# Patient Record
Sex: Female | Born: 1935 | Race: White | Hispanic: No | Smoking: Never smoker
Health system: Southern US, Community
[De-identification: ages and names within clinical notes are randomized; demographics above are authoritative.]

## PROBLEM LIST (undated history)

## (undated) DIAGNOSIS — I10 Essential (primary) hypertension: Secondary | ICD-10-CM

## (undated) DIAGNOSIS — E119 Type 2 diabetes mellitus without complications: Secondary | ICD-10-CM

## (undated) DIAGNOSIS — H548 Legal blindness, as defined in USA: Secondary | ICD-10-CM

## (undated) DIAGNOSIS — M1990 Unspecified osteoarthritis, unspecified site: Secondary | ICD-10-CM

## (undated) DIAGNOSIS — G629 Polyneuropathy, unspecified: Secondary | ICD-10-CM

## (undated) HISTORY — PX: ROTATOR CUFF REPAIR: SHX139

## (undated) HISTORY — PX: PARTIAL HIP ARTHROPLASTY: SHX733

## (undated) HISTORY — PX: APPENDECTOMY: SHX54

## (undated) HISTORY — DX: Legal blindness, as defined in USA: H54.8

## (undated) HISTORY — PX: ABDOMINAL HYSTERECTOMY: SHX81

## (undated) HISTORY — DX: Polyneuropathy, unspecified: G62.9

## (undated) HISTORY — DX: Unspecified osteoarthritis, unspecified site: M19.90

## (undated) HISTORY — PX: CHOLECYSTECTOMY: SHX55

---

## 2021-08-23 ENCOUNTER — Encounter: Payer: Self-pay | Admitting: Urology

## 2021-08-23 ENCOUNTER — Other Ambulatory Visit (HOSPITAL_COMMUNITY): Payer: Self-pay | Admitting: Nurse Practitioner

## 2021-08-23 ENCOUNTER — Other Ambulatory Visit: Payer: Self-pay | Admitting: Nurse Practitioner

## 2021-08-23 ENCOUNTER — Ambulatory Visit (INDEPENDENT_AMBULATORY_CARE_PROVIDER_SITE_OTHER): Payer: Medicare Other | Admitting: Urology

## 2021-08-23 ENCOUNTER — Other Ambulatory Visit: Payer: Self-pay

## 2021-08-23 VITALS — BP 113/67 | HR 83 | Ht 63.0 in | Wt 162.0 lb

## 2021-08-23 DIAGNOSIS — R1013 Epigastric pain: Secondary | ICD-10-CM

## 2021-08-23 DIAGNOSIS — R339 Retention of urine, unspecified: Secondary | ICD-10-CM | POA: Diagnosis not present

## 2021-08-23 DIAGNOSIS — R142 Eructation: Secondary | ICD-10-CM

## 2021-08-23 DIAGNOSIS — R140 Abdominal distension (gaseous): Secondary | ICD-10-CM

## 2021-08-23 NOTE — Progress Notes (Signed)
08/23/2021 10:51 AM   Lisa Davis 09-18-1936 [id number]  Referring provider: No referring provider defined for this encounter.  Chief Complaint  Patient presents with   Other    HPI: Lisa Davis is an 85 y.o. female who presents for evaluation of urinary retention.  She presents today with her daughter.  Sustained a fall 07/30/2021 and had severe low back pain the following day.  Was seen at a local ED and was told she had a compression fracture and prescribed oxycodone and a muscle relaxer Pain did not improve and presented to a different ED on 10/28 and was diagnosed with urinary retention and a Foley catheter was placed She presented [facility] in [place] on 10/31 complaining of continued pain and was admitted with a E. coli UTI, compression fracture L1 and severe constipation A voiding trial was attempted 08/10/2021 however the Foley catheter was replaced for persistent retention 08/11/2021.  Her daughter states she was nonambulatory at the time and a voiding trial Discharged to [employer] with an indwelling Foley.  She is currently undergoing physical therapy and has been upright Her constipation has resolved CT performed during hospitalization showed an 8 mm nonobstructing left lower pole calculus She states she was complaining of bladder pain last night and her Foley catheter was removed but then immediately replaced.  She is complaining of bladder pain   PMH:  Abnormal liver function test   Anemia, iron deficiency   Chronic anxiety   Chronic insomnia   Colon polyp   GERD (gastroesophageal reflux disease)   Hearing loss   Hyperlipemia  Tried several statins, declines trying others   Hypertension   Hypothyroidism   Impacted cerumen  07/13/2009   OA (osteoarthritis)   Osteopenia   Peripheral edema   Recurrent major depressive disorder in remission (CMS-HCC)   Sensorineural hearing loss (SNHL), bilateral 05/19/2012   Type 2 diabetes mellitus  without complication (CMS-HCC)   Surgical History:  APPENDECTOMY   CHOLECYSTECTOMY   HEMIARTHROPLASTY HIP Right 01/2018  After hip fracture   HYSTERECTOMY VAGINAL   Left knee surgery   Right shoulder rotator cuff repair   Home Medications:  Allergies as of 08/23/2021       Reactions   Chlorhexidine Rash   Chlorhexidine Gluconate Hives, Rash   Meperidine Nausea And Vomiting, Palpitations   Tachycardia Tachycardia Tachycardia   Statins Other (See Comments)   Intol to several   Meperidine Hcl    Tachycardia   Codeine Nausea And Vomiting   Metformin And Related Diarrhea   Morphine And Related Nausea And Vomiting   Pedi-pre Tape Spray [wound Dressing Adhesive] Rash   Povidone Iodine Rash        Medication List        Accurate as of August 23, 2021 10:51 AM. If you have any questions, ask your nurse or doctor.          acetaminophen 325 MG tablet Commonly known as: TYLENOL Take by mouth.   Calcium Carb-Cholecalciferol 600-10 MG-MCG Tabs Take 1 tablet by mouth daily.   celecoxib 100 MG capsule Commonly known as: CELEBREX Take by mouth.   clobetasol cream 0.05 % Commonly known as: TEMOVATE Apply topically.   clorazepate 3.75 MG tablet Commonly known as: TRANXENE Take by mouth.   cyanocobalamin 1000 MCG/ML injection Commonly known as: (VITAMIN B-12) Inject into the muscle.   cyclobenzaprine 10 MG tablet Commonly known as: FLEXERIL Take 1 tablet by mouth 3 (three) times daily as needed.   fluticasone 50  MCG/ACT nasal spray Commonly known as: FLONASE Place into the nose.   gabapentin 300 MG capsule Commonly known as: NEURONTIN Take 1 capsule by mouth 3 (three) times daily.   glipiZIDE 5 MG tablet Commonly known as: GLUCOTROL Take by mouth.   latanoprost 0.005 % ophthalmic solution Commonly known as: XALATAN Place 1 drop into both eyes at bedtime.   levothyroxine 100 MCG tablet Commonly known as: SYNTHROID Take by mouth.   levothyroxine  75 MCG tablet Commonly known as: SYNTHROID TAKE 1 TABLET DAILY (REPLACES THE 100 MCG AND THE 88 MCG)   losartan 25 MG tablet Commonly known as: COZAAR Take 25 mg by mouth daily.   meclizine 25 MG tablet Commonly known as: ANTIVERT TAKE 1 TABLET BY MOUTH THREE TIMES DAILY AS NEEDED FOR 10 DAYS as needed   meloxicam 7.5 MG tablet Commonly known as: MOBIC Take 7.5 mg by mouth daily.   metFORMIN 500 MG tablet Commonly known as: GLUCOPHAGE Take 1 tablet by mouth 2 (two) times daily with a meal.   pantoprazole 40 MG tablet Commonly known as: PROTONIX Take by mouth.   perphenazine-amitriptyline 2-25 MG Tabs tablet Commonly known as: ETRAFON/TRIAVIL Take by mouth.   Prodigy No Coding Blood Gluc test strip Generic drug: glucose blood Use to check blood sugar one time daily fasting    Diagnosis: E11.9   propranolol 20 MG tablet Commonly known as: INDERAL Take 1 tablet by mouth 2 (two) times daily.   sertraline 100 MG tablet Commonly known as: ZOLOFT Take 1.5 tablets by mouth daily.   zolpidem 6.25 MG CR tablet Commonly known as: AMBIEN CR Take by mouth.        Allergies:  Allergies  Allergen Reactions   Chlorhexidine Rash   Chlorhexidine Gluconate Hives and Rash   Meperidine Nausea And Vomiting and Palpitations    Tachycardia Tachycardia Tachycardia    Statins Other (See Comments)    Intol to several   Meperidine Hcl     Tachycardia   Codeine Nausea And Vomiting   Metformin And Related Diarrhea   Morphine And Related Nausea And Vomiting   Pedi-Pre Tape Spray [Wound Dressing Adhesive] Rash   Povidone Iodine Rash    Family History: No family history on file.  Social History:  reports that she has never smoked. She has never used smokeless tobacco. She reports that she does not drink alcohol. No history on file for drug use.   Physical Exam: BP 113/67   Pulse 83   Ht 5\' 3"  (1.6 m)   Wt 162 lb (73.5 kg)   BMI 28.70 kg/m   Constitutional:  Alert, No  acute distress. HEENT: [place] AT, moist mucus membranes.  Trachea midline, no masses. Cardiovascular: No clubbing, cyanosis, or edema. Respiratory: Normal respiratory effort, no increased work of breathing. GI: Abdomen is soft, nontender, nondistended, no abdominal masses GU: Foley catheter draining clear urine   Assessment & Plan:    1.  Urinary retention Precipitating factor severe constipation and L1 compression fracture Constipation has resolved and she is currently undergoing physical therapy and ambulating with assistance Recommend catheter removal/voiding trial Order was written for SNF to replace Foley catheter for recurrent retention 1 month follow-up bladder scan for PVR if she is able to void successfully   , MD  [facility] [address] [place], [place] [phone]

## 2021-08-23 NOTE — Progress Notes (Signed)
Catheter Removal  Patient is present today for a catheter removal.  5ml of water was drained from the balloon. A 16FR foley cath was removed from the bladder no complications were noted . Patient tolerated well.  Performed by: Zarinah Oviatt CMA   

## 2021-08-24 ENCOUNTER — Encounter: Payer: Self-pay | Admitting: Urology

## 2022-01-17 ENCOUNTER — Ambulatory Visit (INDEPENDENT_AMBULATORY_CARE_PROVIDER_SITE_OTHER): Payer: Medicare Other | Admitting: Urology

## 2022-01-17 ENCOUNTER — Encounter: Payer: Self-pay | Admitting: Urology

## 2022-01-17 VITALS — BP 136/83 | HR 97 | Ht 63.0 in | Wt 155.0 lb

## 2022-01-17 DIAGNOSIS — N200 Calculus of kidney: Secondary | ICD-10-CM

## 2022-01-17 DIAGNOSIS — R1030 Lower abdominal pain, unspecified: Secondary | ICD-10-CM

## 2022-01-17 DIAGNOSIS — R339 Retention of urine, unspecified: Secondary | ICD-10-CM

## 2022-01-17 LAB — BLADDER SCAN AMB NON-IMAGING: Scan Result: 214

## 2022-01-17 NOTE — Progress Notes (Signed)
? ?01/17/2022 ?4:02 PM  ? ?Lisa Davis ?1936/04/14 ?[id number] ? ?Referring provider: No referring provider defined for this encounter. ? ?Chief Complaint  ?Patient presents with  ? Urinary Retention  ? ? ?HPI: ?86 y.o. female presents for follow-up of urinary retention.  She presents today with her daughter ? ?Refer to my prior office visit 08/23/2021 ?She did not follow-up after catheter removal until now. ?Complains of weak urinary stream and difficulty emptying the bladder ?No fever, chills, gross hematuria or recurrent UTI ? ? ?PMH: ? Abnormal liver function test  ? Anemia, iron deficiency  ? Chronic anxiety  ? Chronic insomnia  ? Colon polyp  ? GERD (gastroesophageal reflux disease)  ? Hearing loss  ? Hyperlipemia  ?Tried several statins, declines trying others  ? Hypertension  ? Hypothyroidism  ? Impacted cerumen  ?07/13/2009  ? OA (osteoarthritis)  ? Osteopenia  ? Peripheral edema  ? Recurrent major depressive disorder in remission (CMS-HCC)  ? Sensorineural hearing loss (SNHL), bilateral 05/19/2012  ? Type 2 diabetes mellitus without complication (CMS-HCC)  ? ?Surgical History: ? APPENDECTOMY  ? CHOLECYSTECTOMY  ? HEMIARTHROPLASTY HIP Right 01/2018  ?After hip fracture  ? HYSTERECTOMY VAGINAL  ? Left knee surgery  ? Right shoulder rotator cuff repair  ? ?Home Medications:  ?Allergies as of 01/17/2022   ? ?   Reactions  ? Chlorhexidine Rash  ? Chlorhexidine Gluconate Hives, Rash  ? Meperidine Nausea And Vomiting, Palpitations  ? Tachycardia ?Tachycardia ?Tachycardia  ? Statins Other (See Comments)  ? Intol to several  ? Meperidine Hcl   ? Tachycardia  ? Codeine Nausea And Vomiting  ? Metformin And Related Diarrhea  ? Morphine And Related Nausea And Vomiting  ? Pedi-pre Tape Spray [wound Dressing Adhesive] Rash  ? Povidone Iodine Rash  ? ?  ? ?  ?Medication List  ?  ? ?  ? Accurate as of January 17, 2022  4:02 PM. If you have any questions, ask your nurse or doctor.  ?  ?  ? ?  ? ?acetaminophen 325 MG  tablet ?Commonly known as: TYLENOL ?Take by mouth. ?  ?Calcium Carb-Cholecalciferol 600-10 MG-MCG Tabs ?Take 1 tablet by mouth daily. ?  ?celecoxib 100 MG capsule ?Commonly known as: CELEBREX ?Take by mouth. ?  ?clobetasol cream 0.05 % ?Commonly known as: TEMOVATE ?Apply topically. ?  ?clorazepate 3.75 MG tablet ?Commonly known as: TRANXENE ?Take by mouth. ?  ?cyanocobalamin 1000 MCG/ML injection ?Commonly known as: (VITAMIN B-12) ?Inject into the muscle. ?  ?cyclobenzaprine 10 MG tablet ?Commonly known as: FLEXERIL ?Take 1 tablet by mouth 3 (three) times daily as needed. ?  ?fluticasone 50 MCG/ACT nasal spray ?Commonly known as: FLONASE ?Place into the nose. ?  ?gabapentin 300 MG capsule ?Commonly known as: NEURONTIN ?Take 1 capsule by mouth 3 (three) times daily. ?  ?glipiZIDE 5 MG tablet ?Commonly known as: GLUCOTROL ?Take by mouth. ?  ?latanoprost 0.005 % ophthalmic solution ?Commonly known as: XALATAN ?Place 1 drop into both eyes at bedtime. ?  ?levothyroxine 100 MCG tablet ?Commonly known as: SYNTHROID ?Take by mouth. ?  ?levothyroxine 75 MCG tablet ?Commonly known as: SYNTHROID ?TAKE 1 TABLET DAILY (REPLACES THE 100 MCG AND THE 88 MCG) ?  ?losartan 25 MG tablet ?Commonly known as: COZAAR ?Take 25 mg by mouth daily. ?  ?meclizine 25 MG tablet ?Commonly known as: ANTIVERT ?TAKE 1 TABLET BY MOUTH THREE TIMES DAILY AS NEEDED FOR 10 DAYS as needed ?  ?meloxicam 7.5 MG tablet ?Commonly known as:  MOBIC ?Take 7.5 mg by mouth daily. ?  ?metFORMIN 500 MG tablet ?Commonly known as: GLUCOPHAGE ?Take 1 tablet by mouth 2 (two) times daily with a meal. ?  ?pantoprazole 40 MG tablet ?Commonly known as: PROTONIX ?Take by mouth. ?  ?perphenazine-amitriptyline 2-25 MG Tabs tablet ?Commonly known as: ETRAFON/TRIAVIL ?Take by mouth. ?  ?propranolol 20 MG tablet ?Commonly known as: INDERAL ?Take 1 tablet by mouth 2 (two) times daily. ?  ?sertraline 100 MG tablet ?Commonly known as: ZOLOFT ?Take 1.5 tablets by mouth daily. ?   ?zolpidem 6.25 MG CR tablet ?Commonly known as: AMBIEN CR ?Take by mouth. ?  ? ?  ? ? ?Allergies:  ?Allergies  ?Allergen Reactions  ? Chlorhexidine Rash  ? Chlorhexidine Gluconate Hives and Rash  ? Meperidine Nausea And Vomiting and Palpitations  ?  Tachycardia ?Tachycardia ?Tachycardia ?  ? Statins Other (See Comments)  ?  Intol to several  ? Meperidine Hcl   ?  Tachycardia  ? Codeine Nausea And Vomiting  ? Metformin And Related Diarrhea  ? Morphine And Related Nausea And Vomiting  ? Pedi-Pre Tape Spray [Wound Dressing Adhesive] Rash  ? Povidone Iodine Rash  ? ? ?Family History: ?History reviewed. No pertinent family history. ? ?Social History:  reports that she has never smoked. She has never used smokeless tobacco. She reports that she does not drink alcohol. No history on file for drug use. ? ? ?Physical Exam: ?BP 136/83   Pulse 97   Ht 5\' 3"  (1.6 m)   Wt 155 lb (70.3 kg)   BMI 27.46 kg/m?   ?Constitutional:  Alert, No acute distress. ?HEENT: [place] AT, moist mucus membranes.  Trachea midline, no masses. ?Cardiovascular: No clubbing, cyanosis, or edema. ?Respiratory: Normal respiratory effort, no increased work of breathing. ? ? ? ?Assessment & Plan:   ? ?1.  Incomplete bladder emptying ?We discussed this is most likely due to decreased detrusor tone ?She was unable to void and estimated bladder volume by bladder scan was 214 mL ?Based on this finding would not recommend an indwelling Foley.  Intermittent catheterization could be performed for patient comfort though she would be unable to do ?I had recommended a renal ultrasound after catheter removal and an order was placed.  Will contact with results ? ? ? ? , MD ? ?[facility] ?[address] ?[place], [place] ?[phone] ? ?

## 2022-01-23 ENCOUNTER — Ambulatory Visit
Admission: RE | Admit: 2022-01-23 | Discharge: 2022-01-23 | Disposition: A | Discharge status: Home / Self Care | Payer: Medicare Other | Source: Ambulatory Visit | Attending: Urology | Admitting: Urology

## 2022-01-23 DIAGNOSIS — R339 Retention of urine, unspecified: Secondary | ICD-10-CM | POA: Diagnosis present

## 2022-01-23 DIAGNOSIS — R1030 Lower abdominal pain, unspecified: Secondary | ICD-10-CM | POA: Insufficient documentation

## 2022-01-23 DIAGNOSIS — N200 Calculus of kidney: Secondary | ICD-10-CM | POA: Insufficient documentation

## 2022-01-25 ENCOUNTER — Telehealth: Payer: Self-pay

## 2022-01-25 NOTE — Telephone Encounter (Signed)
Renal ultrasound showed no evidence of kidney blockage.  She may have a nonobstructing stone in the lower portion of the left kidney which does not require treatment ?

## 2022-01-25 NOTE — Telephone Encounter (Signed)
Notified patient daughter as instructed. 

## 2022-01-25 NOTE — Telephone Encounter (Signed)
Patient's daughter left a voice mail on the triage line requesting a call back with renal u/s results. Please advise ?

## 2022-01-29 ENCOUNTER — Ambulatory Visit: Payer: Medicare Other

## 2022-04-26 ENCOUNTER — Encounter: Payer: Self-pay | Admitting: Physician Assistant

## 2022-04-26 ENCOUNTER — Ambulatory Visit (INDEPENDENT_AMBULATORY_CARE_PROVIDER_SITE_OTHER): Payer: Medicare Other | Admitting: Physician Assistant

## 2022-04-26 VITALS — BP 146/82 | HR 106 | Ht 63.0 in | Wt 155.0 lb

## 2022-04-26 DIAGNOSIS — R350 Frequency of micturition: Secondary | ICD-10-CM | POA: Diagnosis not present

## 2022-04-26 DIAGNOSIS — R300 Dysuria: Secondary | ICD-10-CM | POA: Diagnosis not present

## 2022-04-26 DIAGNOSIS — R1030 Lower abdominal pain, unspecified: Secondary | ICD-10-CM | POA: Diagnosis not present

## 2022-04-26 MED ORDER — CEPHALEXIN 500 MG PO CAPS: 500.0000 mg | ORAL_CAPSULE | Freq: Three times a day (TID) | ORAL | 0 refills | Status: AC

## 2022-04-26 NOTE — Patient Instructions (Signed)
Recurrent UTI Prevention Strategies  Start taking an over-the-counter cranberry supplement for urinary tract health. Take this once or twice daily on an empty stomach, e.g. right before bed. Start taking an over-the-counter d-mannose supplement. Take this daily per packaging instructions. Start taking an over-the-counter probiotic containing the bacterial species called Lactobacillus. Take this daily. 

## 2022-04-26 NOTE — Progress Notes (Signed)
04/26/2022 5:22 PM   Lisa Davis [birth year] [id number]  CC: Chief Complaint  Patient presents with   Urinary Tract Infection   HPI: Lisa Davis is a 86 y.o. female with PMH urinary retention who subsequently passed a voiding trial in April 2023 who presents today for evaluation of lower abdominal pain and dysuria.  She is accompanied today by her daughter and son-in-law, who contributes to HPI.  Today she reports a 2 months long history of painful urination and shooting lower abdominal pain.  She is also having urinary frequency.  She denies fever, chills, nausea, vomiting, or gross hematuria.  She is concerned about her frequency of UTI, stating that this represents her third infection in approximately 10 months.  Per chart review, she grew pansensitive E. coli on 08/08/2021.  I do not see any other positive UA or cultures.   In-office UA today positive for nitrites and 1+ leukocyte esterase; urine microscopy with 11-30 WBCs/HPF and many bacteria.  PMH: No past medical history on file.  Surgical History: No past surgical history on file.  Home Medications:  Allergies as of 04/26/2022       Reactions   Chlorhexidine Rash   Chlorhexidine Gluconate Hives, Rash   Meperidine Nausea And Vomiting, Palpitations   Tachycardia Tachycardia Tachycardia   Statins Other (See Comments)   Intol to several   Meperidine Hcl    Tachycardia   Codeine Nausea And Vomiting   Metformin And Related Diarrhea   Morphine And Related Nausea And Vomiting   Pedi-pre Tape Spray [wound Dressing Adhesive] Rash   Povidone Iodine Rash        Medication List        Accurate as of April 26, 2022  5:22 PM. If you have any questions, ask your nurse or doctor.          acetaminophen 325 MG tablet Commonly known as: TYLENOL Take by mouth.   Calcium Carb-Cholecalciferol 600-10 MG-MCG Tabs Take 1 tablet by mouth daily.   celecoxib 100 MG capsule Commonly known as: CELEBREX Take by  mouth.   cephALEXin 500 MG capsule Commonly known as: Keflex Take 1 capsule (500 mg total) by mouth 3 (three) times daily for 5 days. Started by: Carman Ching, PA-C   clobetasol cream 0.05 % Commonly known as: TEMOVATE Apply topically.   clorazepate 3.75 MG tablet Commonly known as: TRANXENE Take by mouth.   cyanocobalamin 1000 MCG/ML injection Commonly known as: (VITAMIN B-12) Inject into the muscle.   cyclobenzaprine 10 MG tablet Commonly known as: FLEXERIL Take 1 tablet by mouth 3 (three) times daily as needed.   fluticasone 50 MCG/ACT nasal spray Commonly known as: FLONASE Place into the nose.   gabapentin 300 MG capsule Commonly known as: NEURONTIN Take 1 capsule by mouth 3 (three) times daily.   glipiZIDE 5 MG tablet Commonly known as: GLUCOTROL Take by mouth.   latanoprost 0.005 % ophthalmic solution Commonly known as: XALATAN Place 1 drop into both eyes at bedtime.   levothyroxine 100 MCG tablet Commonly known as: SYNTHROID Take by mouth.   levothyroxine 75 MCG tablet Commonly known as: SYNTHROID TAKE 1 TABLET DAILY (REPLACES THE 100 MCG AND THE 88 MCG)   losartan 25 MG tablet Commonly known as: COZAAR Take 25 mg by mouth daily.   meclizine 25 MG tablet Commonly known as: ANTIVERT TAKE 1 TABLET BY MOUTH THREE TIMES DAILY AS NEEDED FOR 10 DAYS as needed   meloxicam 7.5 MG tablet Commonly known as: MOBIC Take  7.5 mg by mouth daily.   metFORMIN 500 MG tablet Commonly known as: GLUCOPHAGE Take 1 tablet by mouth 2 (two) times daily with a meal.   pantoprazole 40 MG tablet Commonly known as: PROTONIX Take by mouth.   perphenazine-amitriptyline 2-25 MG Tabs tablet Commonly known as: ETRAFON/TRIAVIL Take by mouth.   propranolol 20 MG tablet Commonly known as: INDERAL Take 1 tablet by mouth 2 (two) times daily.   sertraline 100 MG tablet Commonly known as: ZOLOFT Take 1.5 tablets by mouth daily.   zolpidem 6.25 MG CR  tablet Commonly known as: AMBIEN CR Take by mouth.        Allergies:  Allergies  Allergen Reactions   Chlorhexidine Rash   Chlorhexidine Gluconate Hives and Rash   Meperidine Nausea And Vomiting and Palpitations    Tachycardia Tachycardia Tachycardia    Statins Other (See Comments)    Intol to several   Meperidine Hcl     Tachycardia   Codeine Nausea And Vomiting   Metformin And Related Diarrhea   Morphine And Related Nausea And Vomiting   Pedi-Pre Tape Spray [Wound Dressing Adhesive] Rash   Povidone Iodine Rash    Family History: No family history on file.  Social History:   reports that she has never smoked. She has never used smokeless tobacco. She reports that she does not drink alcohol. No history on file for drug use.  Physical Exam: BP (!) 146/82   Pulse (!) 106   Ht 5\' 3"  (1.6 m)   Wt 155 lb (70.3 kg)   BMI 27.46 kg/m   Constitutional:  Alert and oriented, no acute distress, nontoxic appearing HEENT: [place], AT Cardiovascular: No clubbing, cyanosis, or edema Respiratory: Normal respiratory effort, no increased work of breathing Skin: No rashes, bruises or suspicious lesions Neurologic: Grossly intact, no focal deficits, moving all 4 extremities Psychiatric: Normal mood and affect  Laboratory Data: Results for orders placed or performed in visit on 04/26/22  Microscopic Examination   Urine  Result Value Ref Range   WBC, UA 11-30 (A) 0 - 5 /hpf   RBC, Urine 0-2 0 - 2 /hpf   Epithelial Cells (non renal) 0-10 0 - 10 /hpf   Bacteria, UA Many (A) None seen/Few  Urinalysis, Complete  Result Value Ref Range   Specific Gravity, UA <1.005 (L) 1.005 - 1.030   pH, UA 5.0 5.0 - 7.5   Color, UA Yellow Yellow   Appearance Ur Clear Clear   Leukocytes,UA 1+ (A) Negative   Protein,UA Negative Negative/Trace   Glucose, UA Negative Negative   Ketones, UA Negative Negative   RBC, UA Negative Negative   Bilirubin, UA Negative Negative   Urobilinogen, Ur 0.2 0.2 -  1.0 mg/dL   Nitrite, UA Positive (A) Negative   Microscopic Examination See below:    Assessment & Plan:   1. Lower abdominal pain UA is grossly infected today consistent with acute cystitis.  Will start empiric Keflex and send for culture for further evaluation.  Per chart review, she does not yet meet the clinical criteria for recurrent UTIs, though we discussed starting cranberry, d-mannose, and lactobacillus containing probiotic supplements for UTI prevention. - Urinalysis, Complete - CULTURE, URINE COMPREHENSIVE - cephALEXin (KEFLEX) 500 MG capsule; Take 1 capsule (500 mg total) by mouth 3 (three) times daily for 5 days.  Dispense: 15 capsule; Refill: 0  Return if symptoms worsen or fail to improve.  04/28/22, PA-C  [facility] [address]  [place], [place] [address] [phone]

## 2022-04-27 LAB — URINALYSIS, COMPLETE
Bilirubin, UA: NEGATIVE
Glucose, UA: NEGATIVE
Ketones, UA: NEGATIVE
Nitrite, UA: POSITIVE — AB
Protein,UA: NEGATIVE
RBC, UA: NEGATIVE
Specific Gravity, UA: <1.005 — ABNORMAL LOW (ref 1.005–1.030)
Urobilinogen, Ur: 0.2 mg/dL (ref 0.2–1.0)
pH, UA: 5 (ref 5.0–7.5)

## 2022-04-27 LAB — MICROSCOPIC EXAMINATION

## 2022-04-30 LAB — CULTURE, URINE COMPREHENSIVE

## 2022-05-03 ENCOUNTER — Emergency Department
Admission: EM | Admit: 2022-05-03 | Discharge: 2022-05-03 | Disposition: A | Discharge status: Home / Self Care | Payer: Medicare Other | Attending: Student in an Organized Health Care Education/Training Program | Admitting: Student in an Organized Health Care Education/Training Program

## 2022-05-03 ENCOUNTER — Emergency Department: Payer: Medicare Other

## 2022-05-03 DIAGNOSIS — R739 Hyperglycemia, unspecified: Secondary | ICD-10-CM | POA: Insufficient documentation

## 2022-05-03 DIAGNOSIS — E860 Dehydration: Secondary | ICD-10-CM | POA: Insufficient documentation

## 2022-05-03 DIAGNOSIS — R4182 Altered mental status, unspecified: Secondary | ICD-10-CM | POA: Diagnosis not present

## 2022-05-03 DIAGNOSIS — R42 Dizziness and giddiness: Secondary | ICD-10-CM | POA: Insufficient documentation

## 2022-05-03 DIAGNOSIS — R531 Weakness: Secondary | ICD-10-CM

## 2022-05-03 DIAGNOSIS — Z20822 Contact with and (suspected) exposure to covid-19: Secondary | ICD-10-CM | POA: Insufficient documentation

## 2022-05-03 LAB — URINALYSIS, ROUTINE W REFLEX MICROSCOPIC
Bilirubin Urine: NEGATIVE
Glucose, UA: 150 mg/dL — AB
Ketones, ur: NEGATIVE mg/dL
Nitrite: NEGATIVE
Protein, ur: NEGATIVE mg/dL
Specific Gravity, Urine: 1.009 (ref 1.005–1.030)
pH: 5 (ref 5.0–8.0)

## 2022-05-03 LAB — COMPREHENSIVE METABOLIC PANEL
ALT: 15 U/L (ref 0–44)
AST: 19 U/L (ref 15–41)
Albumin: 3.1 g/dL — ABNORMAL LOW (ref 3.5–5.0)
Alkaline Phosphatase: 74 U/L (ref 38–126)
Anion gap: 11 (ref 5–15)
BUN: 14 mg/dL (ref 8–23)
CO2: 21 mmol/L — ABNORMAL LOW (ref 22–32)
Calcium: 8.3 mg/dL — ABNORMAL LOW (ref 8.9–10.3)
Chloride: 105 mmol/L (ref 98–111)
Creatinine, Ser: 0.85 mg/dL (ref 0.44–1.00)
GFR, Estimated: >60 mL/min (ref >60)
Glucose, Bld: 240 mg/dL — ABNORMAL HIGH (ref 70–99)
Potassium: 4.3 mmol/L (ref 3.5–5.1)
Sodium: 137 mmol/L (ref 135–145)
Total Bilirubin: 0.7 mg/dL (ref 0.3–1.2)
Total Protein: 6 g/dL — ABNORMAL LOW (ref 6.5–8.1)

## 2022-05-03 LAB — CBC
HCT: 37 % (ref 36.0–46.0)
Hemoglobin: 11.5 g/dL — ABNORMAL LOW (ref 12.0–15.0)
MCH: 29.3 pg (ref 26.0–34.0)
MCHC: 31.1 g/dL (ref 30.0–36.0)
MCV: 94.1 fL (ref 80.0–100.0)
Platelets: 201 10*3/uL (ref 150–400)
RBC: 3.93 MIL/uL (ref 3.87–5.11)
RDW: 14.6 % (ref 11.5–15.5)
WBC: 7.7 10*3/uL (ref 4.0–10.5)
nRBC: 0 % (ref 0.0–0.2)

## 2022-05-03 LAB — CBG MONITORING, ED: Glucose-Capillary: 183 mg/dL — ABNORMAL HIGH (ref 70–99)

## 2022-05-03 LAB — TROPONIN I (HIGH SENSITIVITY)
Troponin I (High Sensitivity): 5 ng/L (ref <18)
Troponin I (High Sensitivity): 7 ng/L (ref <18)

## 2022-05-03 LAB — SARS CORONAVIRUS 2 BY RT PCR: SARS Coronavirus 2 by RT PCR: NEGATIVE

## 2022-05-03 MED ORDER — SODIUM CHLORIDE 0.9 % IV BOLUS
500.0000 mL | Freq: Once | INTRAVENOUS | Status: AC
  Administered 2022-05-03: 500 mL via INTRAVENOUS

## 2022-05-03 NOTE — ED Notes (Signed)
Pt returned from MRI °

## 2022-05-03 NOTE — ED Provider Notes (Signed)
[facility] Provider Note    Event Date/Time   First MD Initiated Contact with Patient 05/03/22 1721     (approximate)   History   Weakness (Pt. To ED via medic for increasing weakness x2 weeks, and hyperglycemia episodes intermittently x "months", and dizziness today. Pt's BGL today at home was 457.)   HPI  Lisa Davis is a 86 y.o. female   history of diabetes presents to the ER for evaluation of increasing weakness for the past several weeks episodes of increasing hyperglycemia over the past several months and some dizziness today.  Has been having trouble getting around without increased assistance from family.  Denies any chest pain or pressure.  No dysuria.  Recently treated for UTI.  Denies any diarrhea.  No shortness of breath.  Denies any numbness or tingling.      Physical Exam   Triage Vital Signs: ED Triage Vitals  Enc Vitals Group     BP 05/03/22 1654 128/77     Pulse Rate 05/03/22 1654 93     Resp 05/03/22 1654 18     Temp 05/03/22 1654 98.2 F (36.8 C)     Temp Source 05/03/22 1654 Oral     SpO2 05/03/22 1654 95 %     Weight 05/03/22 1655 157 lb (71.2 kg)     Height 05/03/22 1655 5\' 3"  (1.6 m)     Head Circumference --      Peak Flow --      Pain Score 05/03/22 1654 3     Pain Loc --      Pain Edu? --      Excl. in GC? --     Most recent vital signs: Vitals:   05/03/22 2200 05/03/22 2230  BP: 122/61 (!) 135/50  Pulse: 79 80  Resp:    Temp:    SpO2: 91% 94%     Constitutional: Alert  Eyes: Conjunctivae are normal.  Head: Atraumatic. Nose: No congestion/rhinnorhea. Mouth/Throat: Mucous membranes are moist.   Neck: Painless ROM.  Cardiovascular:   Good peripheral circulation. No m/g/r Respiratory: Normal respiratory effort.  No retractions.  Gastrointestinal: Soft and nontender.  Musculoskeletal:  no deformity Neurologic:  MAE spontaneously. No gross focal neurologic deficits are appreciated.  Skin:  Skin is  warm, dry and intact. No rash noted. Psychiatric: Mood and affect are normal. Speech and behavior are normal.    ED Results / Procedures / Treatments   Labs (all labs ordered are listed, but only abnormal results are displayed) Labs Reviewed  COMPREHENSIVE METABOLIC PANEL - Abnormal; Notable for the following components:      Result Value   CO2 21 (*)    Glucose, Bld 240 (*)    Calcium 8.3 (*)    Total Protein 6.0 (*)    Albumin 3.1 (*)    All other components within normal limits  CBC - Abnormal; Notable for the following components:   Hemoglobin 11.5 (*)    All other components within normal limits  URINALYSIS, ROUTINE W REFLEX MICROSCOPIC - Abnormal; Notable for the following components:   Color, Urine YELLOW (*)    APPearance CLEAR (*)    Glucose, UA 150 (*)    Hgb urine dipstick SMALL (*)    Leukocytes,Ua TRACE (*)    Bacteria, UA RARE (*)    All other components within normal limits  CBG MONITORING, ED - Abnormal; Notable for the following components:   Glucose-Capillary 183 (*)    All  other components within normal limits  SARS CORONAVIRUS 2 BY RT PCR  TROPONIN I (HIGH SENSITIVITY)  TROPONIN I (HIGH SENSITIVITY)       RADIOLOGY Please see ED Course for my review and interpretation.  I personally reviewed all radiographic images ordered to evaluate for the above acute complaints and reviewed radiology reports and findings.  These findings were personally discussed with the patient.  Please see medical record for radiology report.    PROCEDURES:  Critical Care performed: No  .1-3 Lead EKG Interpretation  Performed by: Willy Eddy, MD Authorized by: Willy Eddy, MD     Interpretation: normal     ECG rate:  80   ECG rate assessment: normal     Rhythm: sinus rhythm      MEDICATIONS ORDERED IN ED: Medications  sodium chloride 0.9 % bolus 500 mL (0 mLs Intravenous Stopped 05/03/22 2006)     IMPRESSION / MDM / ASSESSMENT AND PLAN / ED  COURSE  I reviewed the triage vital signs and the nursing notes.                              Differential diagnosis includes, but is not limited to, dehydration, electrolyte abnormality, anemia, CVA, UTI, pneumonia, COVID  Patient presents to the ER for evaluation of symptoms as described above.  This presenting complaint could reflect a potentially life-threatening illness therefore the patient will be placed on continuous pulse oximetry and telemetry for monitoring.  Laboratory evaluation will be sent to evaluate for the above complaints.  CT imaging for the but differential.    Clinical Course as of 05/03/22 2303  Thu May 03, 2022  1840 Chest x-ray on my review and interpretation does not show any evidence of pneumothorax or consolidation.  CT imaging with no acute abnormality.  Given duration of symptoms and her age and risk factors did recommend MRI to further rule out CVA.  She is feeling improved after IV fluids.  No significant metabolic derangement does have mild hyperglycemia but no AKI, DKA or HHS.  [PR]  2256 Patient reassessed.  MRI negative for CVA.  She feels well after IV fluids.  Suspect some mild dehydration.  She does not have any symptoms of dysuria.  Urinalysis does not appear acutely infected improved from previous after course of antibiotics.  She feels well and is requesting discharge home.  At this point given her reassuring work-up I think that is appropriate. [PR]    Clinical Course User Index [PR] Willy Eddy, MD     FINAL CLINICAL IMPRESSION(S) / ED DIAGNOSES   Final diagnoses:  Weakness  Dehydration     Rx / DC Orders   ED Discharge Orders     None        Note:  This document was prepared using Dragon voice recognition software and may include unintentional dictation errors.    Willy Eddy, MD 05/03/22 2325

## 2022-05-03 NOTE — ED Triage Notes (Signed)
Pt. To ED via medic for increasing weakness x2 weeks, and hyperglycemia episodes intermittently x "months", and dizziness today. Pt's BGL today at home was 457.

## 2022-05-03 NOTE — ED Notes (Signed)
First nurse note. Per EMS pt has not been feeling well. BS 457. Pt received NS en route. 140/65 sitting. 112/66 standing.

## 2022-05-06 ENCOUNTER — Emergency Department
Admission: EM | Admit: 2022-05-06 | Discharge: 2022-05-06 | Disposition: A | Discharge status: Home / Self Care | Payer: Medicare Other | Attending: Emergency Medicine | Admitting: Emergency Medicine

## 2022-05-06 ENCOUNTER — Emergency Department: Payer: Medicare Other

## 2022-05-06 ENCOUNTER — Other Ambulatory Visit: Payer: Self-pay

## 2022-05-06 DIAGNOSIS — E119 Type 2 diabetes mellitus without complications: Secondary | ICD-10-CM | POA: Insufficient documentation

## 2022-05-06 DIAGNOSIS — X58XXXA Exposure to other specified factors, initial encounter: Secondary | ICD-10-CM | POA: Insufficient documentation

## 2022-05-06 DIAGNOSIS — R600 Localized edema: Secondary | ICD-10-CM | POA: Insufficient documentation

## 2022-05-06 DIAGNOSIS — S3992XA Unspecified injury of lower back, initial encounter: Secondary | ICD-10-CM | POA: Diagnosis present

## 2022-05-06 DIAGNOSIS — Z20822 Contact with and (suspected) exposure to covid-19: Secondary | ICD-10-CM | POA: Insufficient documentation

## 2022-05-06 DIAGNOSIS — R10814 Left lower quadrant abdominal tenderness: Secondary | ICD-10-CM | POA: Insufficient documentation

## 2022-05-06 DIAGNOSIS — S32018A Other fracture of first lumbar vertebra, initial encounter for closed fracture: Secondary | ICD-10-CM | POA: Diagnosis not present

## 2022-05-06 DIAGNOSIS — I10 Essential (primary) hypertension: Secondary | ICD-10-CM | POA: Insufficient documentation

## 2022-05-06 DIAGNOSIS — S32010A Wedge compression fracture of first lumbar vertebra, initial encounter for closed fracture: Secondary | ICD-10-CM

## 2022-05-06 DIAGNOSIS — R197 Diarrhea, unspecified: Secondary | ICD-10-CM | POA: Diagnosis not present

## 2022-05-06 LAB — COMPREHENSIVE METABOLIC PANEL
ALT: 16 U/L (ref 0–44)
AST: 19 U/L (ref 15–41)
Albumin: 3.2 g/dL — ABNORMAL LOW (ref 3.5–5.0)
Alkaline Phosphatase: 79 U/L (ref 38–126)
Anion gap: 7 (ref 5–15)
BUN: 10 mg/dL (ref 8–23)
CO2: 25 mmol/L (ref 22–32)
Calcium: 8.5 mg/dL — ABNORMAL LOW (ref 8.9–10.3)
Chloride: 105 mmol/L (ref 98–111)
Creatinine, Ser: 0.7 mg/dL (ref 0.44–1.00)
GFR, Estimated: >60 mL/min (ref >60)
Glucose, Bld: 233 mg/dL — ABNORMAL HIGH (ref 70–99)
Potassium: 4.3 mmol/L (ref 3.5–5.1)
Sodium: 137 mmol/L (ref 135–145)
Total Bilirubin: 0.6 mg/dL (ref 0.3–1.2)
Total Protein: 6.4 g/dL — ABNORMAL LOW (ref 6.5–8.1)

## 2022-05-06 LAB — URINALYSIS, ROUTINE W REFLEX MICROSCOPIC
Bilirubin Urine: NEGATIVE
Glucose, UA: NEGATIVE mg/dL
Hgb urine dipstick: NEGATIVE
Ketones, ur: NEGATIVE mg/dL
Nitrite: NEGATIVE
Protein, ur: NEGATIVE mg/dL
Specific Gravity, Urine: 1.036 — ABNORMAL HIGH (ref 1.005–1.030)
pH: 5 (ref 5.0–8.0)

## 2022-05-06 LAB — CBC
HCT: 37.7 % (ref 36.0–46.0)
Hemoglobin: 11.7 g/dL — ABNORMAL LOW (ref 12.0–15.0)
MCH: 28.7 pg (ref 26.0–34.0)
MCHC: 31 g/dL (ref 30.0–36.0)
MCV: 92.4 fL (ref 80.0–100.0)
Platelets: 238 10*3/uL (ref 150–400)
RBC: 4.08 MIL/uL (ref 3.87–5.11)
RDW: 14.8 % (ref 11.5–15.5)
WBC: 7.5 10*3/uL (ref 4.0–10.5)
nRBC: 0 % (ref 0.0–0.2)

## 2022-05-06 LAB — LIPASE, BLOOD: Lipase: 22 U/L (ref 11–51)

## 2022-05-06 LAB — SARS CORONAVIRUS 2 BY RT PCR: SARS Coronavirus 2 by RT PCR: NEGATIVE

## 2022-05-06 LAB — BRAIN NATRIURETIC PEPTIDE: B Natriuretic Peptide: 109.4 pg/mL — ABNORMAL HIGH (ref 0.0–100.0)

## 2022-05-06 MED ORDER — IOHEXOL 300 MG/ML  SOLN
100.0000 mL | Freq: Once | INTRAMUSCULAR | Status: AC | PRN
  Administered 2022-05-06: 100 mL via INTRAVENOUS

## 2022-05-06 NOTE — ED Provider Notes (Signed)
[facility] Provider Note    Event Date/Time   First MD Initiated Contact with Patient 05/06/22 1332     (approximate)   History   Diarrhea   HPI  Lisa Davis is a 86 y.o. female with a past medical history of GERD, diabetes, hypertension, depression who presents today for evaluation of diarrhea.  Patient presents with her daughter and son-in-law who contribute to the history.  Patient has reportedly has had 1-2 loose stools daily since October of last year.  Daughter-in-law reports that this has increased over the past 2 days.  Patient recently finished Keflex for UTI.  No recent travel.  No blood in her stool or black stool.  Patient denies any abdominal pain, nausea, vomiting.  History of appendectomy via exploratory laparotomy as a teenager.  She is up-to-date on her colonoscopies and recently saw GI doctor 2 days ago.  She reports that she feels bloated.  Patient was seen in the emergency department 3 days ago for weakness and dizziness.  She also had a MRI of her brain for evaluation of CVA which was negative.  She was seen by urology for lower abdominal pain on 04/26/2022 and was diagnosed with a urinary tract infection and started on Keflex.  She was also seen by gastroenterology 2 days ago for belching and was instructed to stop her Metamucil and switch to Citrucel.  There are no problems to display for this patient.          Physical Exam   Triage Vital Signs: ED Triage Vitals  Enc Vitals Group     BP 05/06/22 1314 134/68     Pulse Rate 05/06/22 1314 90     Resp 05/06/22 1314 14     Temp 05/06/22 1314 98.7 F (37.1 C)     Temp Source 05/06/22 1314 Oral     SpO2 05/06/22 1314 95 %     Weight 05/06/22 1315 156 lb 15.5 oz (71.2 kg)     Height --      Head Circumference --      Peak Flow --      Pain Score 05/06/22 1315 0     Pain Loc --      Pain Edu? --      Excl. in GC? --     Most recent vital signs: Vitals:   05/06/22 1745  05/06/22 1800  BP:  (!) 150/73  Pulse: 80 79  Resp:    Temp:    SpO2: 93% 94%    Physical Exam Vitals and nursing note reviewed.  Constitutional:      General: Awake and alert. No acute distress.    Appearance: Normal appearance. The patient is normal weight.  HENT:     Head: Normocephalic and atraumatic.     Mouth: Mucous membranes are moist.  Eyes:     General: PERRL. Normal EOMs        Right eye: No discharge.        Left eye: No discharge.     Conjunctiva/sclera: Conjunctivae normal.  Cardiovascular:     Rate and Rhythm: Normal rate and regular rhythm.     Pulses: Normal pulses.     Heart sounds: Normal heart sounds Pulmonary:     Effort: Pulmonary effort is normal. No respiratory distress.     Breath sounds: Normal breath sounds.  Abdominal:     Abdomen is soft. There is mild LLQ abdominal tenderness to deep palpation. No rebound or guarding. No  distention. Musculoskeletal:        General: No swelling. Normal range of motion.     Cervical back: Normal range of motion and neck supple. Back: No midline tenderness. Strength and sensation 5/5 to bilateral lower extremities. Normal great toe extension against resistance. Normal sensation throughout feet. Normal patellar reflexes. Negative SLR and opposite SLR bilaterally.  Edema noted to bilateral lower extremities, though no pitting edema and no skin changes.  Nontender to palpation. Skin:    General: Skin is warm and dry.     Capillary Refill: Capillary refill takes less than 2 seconds.     Findings: No rash.  Neurological:     Mental Status: The patient is awake and alert.      ED Results / Procedures / Treatments   Labs (all labs ordered are listed, but only abnormal results are displayed) Labs Reviewed  COMPREHENSIVE METABOLIC PANEL - Abnormal; Notable for the following components:      Result Value   Glucose, Bld 233 (*)    Calcium 8.5 (*)    Total Protein 6.4 (*)    Albumin 3.2 (*)    All other components  within normal limits  CBC - Abnormal; Notable for the following components:   Hemoglobin 11.7 (*)    All other components within normal limits  BRAIN NATRIURETIC PEPTIDE - Abnormal; Notable for the following components:   B Natriuretic Peptide 109.4 (*)    All other components within normal limits  SARS CORONAVIRUS 2 BY RT PCR  GASTROINTESTINAL PANEL BY PCR, STOOL (REPLACES STOOL CULTURE)  C DIFFICILE QUICK SCREEN W PCR REFLEX    LIPASE, BLOOD  URINALYSIS, ROUTINE W REFLEX MICROSCOPIC     EKG     RADIOLOGY I independently reviewed and interpreted imaging and agree with radiologists findings.     PROCEDURES:  Critical Care performed:   Procedures   MEDICATIONS ORDERED IN ED: Medications  iohexol (OMNIPAQUE) 300 MG/ML solution 100 mL (100 mLs Intravenous Contrast Given 05/06/22 1614)     IMPRESSION / MDM / ASSESSMENT AND PLAN / ED COURSE  I reviewed the triage vital signs and the nursing notes.   Differential diagnosis includes, but is not limited to, infectious colitis, Clostridium difficile, viral gastroenteritis, bacterial gastroenteritis, diverticulitis, dehydration, electrolyte disarray.  Patient presents emergency department awake and alert, hemodynamically stable and afebrile.  Labs obtained are overall reassuring without leukocytosis or electrolyte disarray. SEG:BTDVVOHYWV ratio does not suggest dehydration.  Patient has been in the emergency department for nearly 7 hours and has not been able to provide a stool sample.  No further diarrhea.  She has normal strength and sensation in bilateral lower extremities, no saddle anesthesia or retention to suggest cord compression.  However, CT scan does reveal a pression fracture of the L1 vertebral body with retropulsion of bone into the spinal canal measuring 9 mm.  Given this retropulsion, reported lower extremity weakness and diarrhea, will obtain MRI for evaluation of cord compression.  Patient has a normal strength  and sensation of bilateral lower extremities.  No cord compression noted on MRI.  Lower extremity ultrasound is normal bilaterally without DVT.  She does not appear overtly volume overloaded.  She is able to maintain her oxygen saturation while lying flat, does not have shortness of breath   I discussed the MRI/CT findings with Dr. Madaline Brilliant, neurosurgery on-call who feels that no further intervention is indicated, though does feel that she should be reevaluated in his clinic.  Family agrees to do so.  Patient was unable to produce a sample of stool.  Orders are in place, instructed to bring stool to lab as advised by my colleague.  Also recommended close outpatient follow-up with gastroenterology and her primary care provider.  We discussed return precautions at length.  Patient and family understand and agree with plan.  She was discharged in stable condition.  Patient was also seen and evaluated by Dr. Fuller Plan who agrees with assessment and plan.   Patient's presentation is most consistent with acute complicated illness / injury requiring diagnostic workup.   Clinical Course as of 05/06/22 2029  Wynelle Link May 06, 2022  2020 Discussed with Dr. Madaline Brilliant, neurosurgery who recommends outpatient follow-up. [JP]    Clinical Course User Index [JP] Elisabella Hacker, Herb Grays, PA-C     FINAL CLINICAL IMPRESSION(S) / ED DIAGNOSES   Final diagnoses:  Diarrhea, unspecified type  Closed compression fracture of body of L1 vertebra (HCC)     Rx / DC Orders   ED Discharge Orders     None        Note:  This document was prepared using Dragon voice recognition software and may include unintentional dictation errors.   Keturah Shavers 05/06/22 2029    Concha Se, MD 05/07/22 [phone]

## 2022-05-06 NOTE — Discharge Instructions (Signed)
Please follow-up with neurosurgery regarding the compression fracture.  As discussed, you will sample may be beneficial in determining the cause of your diarrhea.  You are unable to produce a stool sample today.  Please return for any new, worsening, or change in symptoms or other concerns including worsening back pain, weakness in your legs, going to the bathroom on yourself without realizing it, fevers, blood in your stool, vomiting, or any other concerns.  It was a pleasure caring for you today.

## 2022-05-06 NOTE — ED Triage Notes (Signed)
Pt presents via POV c/o diarrhea since Friday. Reports seen in ED on Thursday of last week due to hyperglycemia and diarrhea. Family reports hx of diarrhea but is "worse than normal". Reports pt is having increasing weakness.

## 2022-05-07 ENCOUNTER — Other Ambulatory Visit
Admission: RE | Admit: 2022-05-07 | Discharge: 2022-05-07 | Disposition: A | Discharge status: Home / Self Care | Payer: Medicare Other | Source: Ambulatory Visit | Attending: Student | Admitting: Student

## 2022-05-07 ENCOUNTER — Other Ambulatory Visit: Payer: Self-pay | Admitting: Nurse Practitioner

## 2022-05-07 DIAGNOSIS — M4856XA Collapsed vertebra, not elsewhere classified, lumbar region, initial encounter for fracture: Secondary | ICD-10-CM | POA: Diagnosis not present

## 2022-05-07 DIAGNOSIS — R197 Diarrhea, unspecified: Secondary | ICD-10-CM | POA: Insufficient documentation

## 2022-05-07 DIAGNOSIS — R1310 Dysphagia, unspecified: Secondary | ICD-10-CM

## 2022-05-07 DIAGNOSIS — R142 Eructation: Secondary | ICD-10-CM

## 2022-05-07 LAB — C DIFFICILE QUICK SCREEN W PCR REFLEX
C Diff antigen: POSITIVE — AB
C Diff toxin: NEGATIVE

## 2022-05-07 LAB — CLOSTRIDIUM DIFFICILE BY PCR, REFLEXED: Toxigenic C. Difficile by PCR: POSITIVE — AB

## 2022-05-08 ENCOUNTER — Other Ambulatory Visit: Payer: Self-pay | Admitting: Nurse Practitioner

## 2022-05-08 ENCOUNTER — Telehealth: Payer: Self-pay | Admitting: Emergency Medicine

## 2022-05-08 DIAGNOSIS — R142 Eructation: Secondary | ICD-10-CM

## 2022-05-08 DIAGNOSIS — R1310 Dysphagia, unspecified: Secondary | ICD-10-CM

## 2022-05-08 MED ORDER — VANCOMYCIN HCL 250 MG PO CAPS: 250.0000 mg | ORAL_CAPSULE | Freq: Four times a day (QID) | ORAL | 0 refills | Status: DC

## 2022-05-08 NOTE — Telephone Encounter (Signed)
Needs prescription sent

## 2022-05-11 LAB — STOOL CULTURE: E coli, Shiga toxin Assay: NEGATIVE

## 2022-05-11 LAB — STOOL CULTURE REFLEX - CMPCXR

## 2022-05-11 LAB — STOOL CULTURE REFLEX - RSASHR

## 2022-05-16 ENCOUNTER — Emergency Department: Payer: Medicare Other

## 2022-05-16 ENCOUNTER — Inpatient Hospital Stay
Admission: EM | Admit: 2022-05-16 | Discharge: 2022-05-21 | DRG: 371 | Disposition: A | Discharge status: Home Health | Payer: Medicare Other | Attending: Internal Medicine | Admitting: Internal Medicine

## 2022-05-16 ENCOUNTER — Encounter: Payer: Self-pay | Admitting: Emergency Medicine

## 2022-05-16 ENCOUNTER — Other Ambulatory Visit: Payer: Self-pay

## 2022-05-16 DIAGNOSIS — Z90710 Acquired absence of both cervix and uterus: Secondary | ICD-10-CM

## 2022-05-16 DIAGNOSIS — E1165 Type 2 diabetes mellitus with hyperglycemia: Secondary | ICD-10-CM

## 2022-05-16 DIAGNOSIS — F411 Generalized anxiety disorder: Secondary | ICD-10-CM

## 2022-05-16 DIAGNOSIS — A0472 Enterocolitis due to Clostridium difficile, not specified as recurrent: Principal | ICD-10-CM | POA: Diagnosis present

## 2022-05-16 DIAGNOSIS — Z7984 Long term (current) use of oral hypoglycemic drugs: Secondary | ICD-10-CM

## 2022-05-16 DIAGNOSIS — J9601 Acute respiratory failure with hypoxia: Secondary | ICD-10-CM | POA: Diagnosis present

## 2022-05-16 DIAGNOSIS — Z96641 Presence of right artificial hip joint: Secondary | ICD-10-CM | POA: Diagnosis present

## 2022-05-16 DIAGNOSIS — K219 Gastro-esophageal reflux disease without esophagitis: Secondary | ICD-10-CM

## 2022-05-16 DIAGNOSIS — E785 Hyperlipidemia, unspecified: Secondary | ICD-10-CM | POA: Diagnosis present

## 2022-05-16 DIAGNOSIS — F329 Major depressive disorder, single episode, unspecified: Secondary | ICD-10-CM

## 2022-05-16 DIAGNOSIS — H548 Legal blindness, as defined in USA: Secondary | ICD-10-CM | POA: Diagnosis present

## 2022-05-16 DIAGNOSIS — Z888 Allergy status to other drugs, medicaments and biological substances status: Secondary | ICD-10-CM

## 2022-05-16 DIAGNOSIS — E119 Type 2 diabetes mellitus without complications: Secondary | ICD-10-CM

## 2022-05-16 DIAGNOSIS — I959 Hypotension, unspecified: Secondary | ICD-10-CM | POA: Diagnosis present

## 2022-05-16 DIAGNOSIS — I10 Essential (primary) hypertension: Secondary | ICD-10-CM | POA: Diagnosis present

## 2022-05-16 DIAGNOSIS — R531 Weakness: Secondary | ICD-10-CM | POA: Diagnosis not present

## 2022-05-16 DIAGNOSIS — Z79899 Other long term (current) drug therapy: Secondary | ICD-10-CM

## 2022-05-16 DIAGNOSIS — R339 Retention of urine, unspecified: Secondary | ICD-10-CM

## 2022-05-16 DIAGNOSIS — Z9049 Acquired absence of other specified parts of digestive tract: Secondary | ICD-10-CM

## 2022-05-16 DIAGNOSIS — Z79890 Hormone replacement therapy (postmenopausal): Secondary | ICD-10-CM

## 2022-05-16 DIAGNOSIS — E039 Hypothyroidism, unspecified: Secondary | ICD-10-CM

## 2022-05-16 DIAGNOSIS — R5381 Other malaise: Secondary | ICD-10-CM

## 2022-05-16 DIAGNOSIS — R338 Other retention of urine: Secondary | ICD-10-CM

## 2022-05-16 DIAGNOSIS — E876 Hypokalemia: Secondary | ICD-10-CM | POA: Diagnosis present

## 2022-05-16 HISTORY — DX: Type 2 diabetes mellitus without complications: E11.9

## 2022-05-16 HISTORY — DX: Essential (primary) hypertension: I10

## 2022-05-16 LAB — COMPREHENSIVE METABOLIC PANEL
ALT: 13 U/L (ref 0–44)
AST: 26 U/L (ref 15–41)
Albumin: 2.7 g/dL — ABNORMAL LOW (ref 3.5–5.0)
Alkaline Phosphatase: 81 U/L (ref 38–126)
Anion gap: 9 (ref 5–15)
BUN: 11 mg/dL (ref 8–23)
CO2: 25 mmol/L (ref 22–32)
Calcium: 8.5 mg/dL — ABNORMAL LOW (ref 8.9–10.3)
Chloride: 106 mmol/L (ref 98–111)
Creatinine, Ser: 0.76 mg/dL (ref 0.44–1.00)
GFR, Estimated: >60 mL/min (ref >60)
Glucose, Bld: 198 mg/dL — ABNORMAL HIGH (ref 70–99)
Potassium: 3.8 mmol/L (ref 3.5–5.1)
Sodium: 140 mmol/L (ref 135–145)
Total Bilirubin: 0.9 mg/dL (ref 0.3–1.2)
Total Protein: 6.2 g/dL — ABNORMAL LOW (ref 6.5–8.1)

## 2022-05-16 LAB — CBC WITH DIFFERENTIAL/PLATELET
Abs Immature Granulocytes: 0.15 10*3/uL — ABNORMAL HIGH (ref 0.00–0.07)
Basophils Absolute: 0.1 10*3/uL (ref 0.0–0.1)
Basophils Relative: 1 %
Eosinophils Absolute: 0.3 10*3/uL (ref 0.0–0.5)
Eosinophils Relative: 3 %
HCT: 36.1 % (ref 36.0–46.0)
Hemoglobin: 11.3 g/dL — ABNORMAL LOW (ref 12.0–15.0)
Immature Granulocytes: 2 %
Lymphocytes Relative: 11 %
Lymphs Abs: 1.1 10*3/uL (ref 0.7–4.0)
MCH: 28.3 pg (ref 26.0–34.0)
MCHC: 31.3 g/dL (ref 30.0–36.0)
MCV: 90.5 fL (ref 80.0–100.0)
Monocytes Absolute: 0.4 10*3/uL (ref 0.1–1.0)
Monocytes Relative: 4 %
Neutro Abs: 8 10*3/uL — ABNORMAL HIGH (ref 1.7–7.7)
Neutrophils Relative %: 79 %
Platelets: 312 10*3/uL (ref 150–400)
RBC: 3.99 MIL/uL (ref 3.87–5.11)
RDW: 15.2 % (ref 11.5–15.5)
WBC: 9.9 10*3/uL (ref 4.0–10.5)
nRBC: 0 % (ref 0.0–0.2)

## 2022-05-16 LAB — CBG MONITORING, ED: Glucose-Capillary: 150 mg/dL — ABNORMAL HIGH (ref 70–99)

## 2022-05-16 LAB — TROPONIN I (HIGH SENSITIVITY)
Troponin I (High Sensitivity): 10 ng/L (ref <18)
Troponin I (High Sensitivity): 10 ng/L (ref <18)

## 2022-05-16 LAB — PHOSPHORUS: Phosphorus: 2 mg/dL — ABNORMAL LOW (ref 2.5–4.6)

## 2022-05-16 LAB — VITAMIN B12: Vitamin B-12: 4096 pg/mL — ABNORMAL HIGH (ref 180–914)

## 2022-05-16 LAB — MAGNESIUM: Magnesium: 1.1 mg/dL — ABNORMAL LOW (ref 1.7–2.4)

## 2022-05-16 LAB — GLUCOSE, CAPILLARY: Glucose-Capillary: 138 mg/dL — ABNORMAL HIGH (ref 70–99)

## 2022-05-16 MED ORDER — LEVOTHYROXINE SODIUM 25 MCG PO TABS
75.0000 ug | ORAL_TABLET | Freq: Every day | ORAL | Status: DC
  Administered 2022-05-17 – 2022-05-21 (×5): 75 ug via ORAL
  Filled 2022-05-16 (×5): qty 1

## 2022-05-16 MED ORDER — PERPHENAZINE-AMITRIPTYLINE 2-25 MG PO TABS: 1.0000 | ORAL_TABLET | ORAL | Status: DC

## 2022-05-16 MED ORDER — VANCOMYCIN HCL 250 MG PO CAPS
250.0000 mg | ORAL_CAPSULE | Freq: Four times a day (QID) | ORAL | Status: DC
  Administered 2022-05-16 – 2022-05-17 (×3): 250 mg via ORAL
  Filled 2022-05-16 (×4): qty 1

## 2022-05-16 MED ORDER — INSULIN ASPART 100 UNIT/ML IJ SOLN
0.0000 [IU] | Freq: Every day | INTRAMUSCULAR | Status: DC
  Administered 2022-05-19 – 2022-05-20 (×2): 2 [IU] via SUBCUTANEOUS
  Filled 2022-05-16 (×2): qty 1

## 2022-05-16 MED ORDER — PANTOPRAZOLE SODIUM 40 MG PO TBEC
40.0000 mg | DELAYED_RELEASE_TABLET | Freq: Every day | ORAL | Status: DC
  Administered 2022-05-17 – 2022-05-20 (×4): 40 mg via ORAL
  Filled 2022-05-16 (×4): qty 1

## 2022-05-16 MED ORDER — INSULIN ASPART 100 UNIT/ML IJ SOLN
0.0000 [IU] | Freq: Three times a day (TID) | INTRAMUSCULAR | Status: DC
  Administered 2022-05-16: 2 [IU] via SUBCUTANEOUS
  Administered 2022-05-17: 3 [IU] via SUBCUTANEOUS
  Administered 2022-05-17 (×2): 5 [IU] via SUBCUTANEOUS
  Administered 2022-05-18 (×2): 8 [IU] via SUBCUTANEOUS
  Administered 2022-05-19: 3 [IU] via SUBCUTANEOUS
  Administered 2022-05-19: 5 [IU] via SUBCUTANEOUS
  Administered 2022-05-19 – 2022-05-20 (×2): 8 [IU] via SUBCUTANEOUS
  Administered 2022-05-20: 3 [IU] via SUBCUTANEOUS
  Administered 2022-05-20 – 2022-05-21 (×2): 8 [IU] via SUBCUTANEOUS
  Administered 2022-05-21: 3 [IU] via SUBCUTANEOUS
  Filled 2022-05-16 (×14): qty 1

## 2022-05-16 MED ORDER — SERTRALINE HCL 50 MG PO TABS
150.0000 mg | ORAL_TABLET | Freq: Every day | ORAL | Status: DC
  Administered 2022-05-17 – 2022-05-21 (×5): 150 mg via ORAL
  Filled 2022-05-16 (×5): qty 3

## 2022-05-16 MED ORDER — ACETAMINOPHEN 325 MG PO TABS
650.0000 mg | ORAL_TABLET | Freq: Four times a day (QID) | ORAL | Status: AC | PRN
  Administered 2022-05-16 – 2022-05-21 (×4): 650 mg via ORAL
  Filled 2022-05-16 (×6): qty 2

## 2022-05-16 MED ORDER — CLORAZEPATE DIPOTASSIUM 3.75 MG PO TABS
3.7500 mg | ORAL_TABLET | Freq: Two times a day (BID) | ORAL | Status: DC | PRN
  Administered 2022-05-18 – 2022-05-21 (×6): 3.75 mg via ORAL
  Filled 2022-05-16 (×8): qty 1

## 2022-05-16 MED ORDER — ENOXAPARIN SODIUM 40 MG/0.4ML IJ SOSY
40.0000 mg | PREFILLED_SYRINGE | Freq: Every day | INTRAMUSCULAR | Status: DC
Start: 2022-05-16 — End: 2022-05-17
  Administered 2022-05-16: 40 mg via SUBCUTANEOUS
  Filled 2022-05-16: qty 0.4

## 2022-05-16 MED ORDER — ONDANSETRON HCL 4 MG/2ML IJ SOLN: 4.0000 mg | Freq: Four times a day (QID) | INTRAMUSCULAR | Status: AC | PRN

## 2022-05-16 MED ORDER — PNEUMOCOCCAL 20-VAL CONJ VACC 0.5 ML IM SUSY
0.5000 mL | PREFILLED_SYRINGE | INTRAMUSCULAR | Status: DC
  Filled 2022-05-16: qty 0.5

## 2022-05-16 MED ORDER — ZOLPIDEM TARTRATE 5 MG PO TABS
5.0000 mg | ORAL_TABLET | Freq: Every evening | ORAL | Status: DC | PRN
  Administered 2022-05-16 – 2022-05-20 (×5): 5 mg via ORAL
  Filled 2022-05-16 (×5): qty 1

## 2022-05-16 MED ORDER — MAGNESIUM SULFATE 2 GM/50ML IV SOLN
2.0000 g | Freq: Once | INTRAVENOUS | Status: AC
  Administered 2022-05-16: 2 g via INTRAVENOUS

## 2022-05-16 MED ORDER — TRAMADOL HCL 50 MG PO TABS
50.0000 mg | ORAL_TABLET | Freq: Two times a day (BID) | ORAL | Status: DC | PRN
  Administered 2022-05-19 – 2022-05-21 (×4): 50 mg via ORAL
  Filled 2022-05-16 (×6): qty 1

## 2022-05-16 MED ORDER — LOSARTAN POTASSIUM 25 MG PO TABS
25.0000 mg | ORAL_TABLET | Freq: Every day | ORAL | Status: DC
  Filled 2022-05-16: qty 1

## 2022-05-16 MED ORDER — ACETAMINOPHEN 650 MG RE SUPP
650.0000 mg | Freq: Four times a day (QID) | RECTAL | Status: AC | PRN
Start: 2022-05-16 — End: 2022-05-21
  Administered 2022-05-17: 650 mg via RECTAL

## 2022-05-16 MED ORDER — AMITRIPTYLINE HCL 25 MG PO TABS
25.0000 mg | ORAL_TABLET | Freq: Every day | ORAL | Status: DC
  Administered 2022-05-16 – 2022-05-19 (×4): 25 mg via ORAL
  Filled 2022-05-16 (×4): qty 1

## 2022-05-16 MED ORDER — VANCOMYCIN HCL 250 MG PO CAPS
250.0000 mg | ORAL_CAPSULE | Freq: Four times a day (QID) | ORAL | Status: DC
  Filled 2022-05-16 (×2): qty 1

## 2022-05-16 MED ORDER — MECLIZINE HCL 25 MG PO TABS
25.0000 mg | ORAL_TABLET | Freq: Two times a day (BID) | ORAL | Status: DC
  Administered 2022-05-16 – 2022-05-19 (×3): 25 mg via ORAL
  Filled 2022-05-16 (×10): qty 1

## 2022-05-16 MED ORDER — ONDANSETRON HCL 4 MG PO TABS: 4.0000 mg | ORAL_TABLET | Freq: Four times a day (QID) | ORAL | Status: AC | PRN

## 2022-05-16 MED ORDER — PERPHENAZINE 4 MG PO TABS
2.0000 mg | ORAL_TABLET | Freq: Every day | ORAL | Status: DC
  Administered 2022-05-16 – 2022-05-19 (×4): 2 mg via ORAL
  Filled 2022-05-16 (×4): qty 1

## 2022-05-16 MED ORDER — IOHEXOL 300 MG/ML  SOLN
100.0000 mL | Freq: Once | INTRAMUSCULAR | Status: AC | PRN
  Administered 2022-05-16: 100 mL via INTRAVENOUS

## 2022-05-16 MED ORDER — SODIUM PHOSPHATES 45 MMOLE/15ML IV SOLN
15.0000 mmol | Freq: Once | INTRAVENOUS | Status: AC
  Administered 2022-05-16: 15 mmol via INTRAVENOUS
  Filled 2022-05-16: qty 5

## 2022-05-16 MED ORDER — PROPRANOLOL HCL 20 MG PO TABS
20.0000 mg | ORAL_TABLET | Freq: Two times a day (BID) | ORAL | Status: DC
  Administered 2022-05-16: 20 mg via ORAL
  Filled 2022-05-16 (×2): qty 1

## 2022-05-16 MED ORDER — FLUTICASONE PROPIONATE 50 MCG/ACT NA SUSP: 1.0000 | Freq: Every day | NASAL | Status: DC | PRN

## 2022-05-16 MED ORDER — CYANOCOBALAMIN 1000 MCG/ML IJ SOLN
1000.0000 ug | INTRAMUSCULAR | Status: DC
  Filled 2022-05-16: qty 1

## 2022-05-16 NOTE — Assessment & Plan Note (Addendum)
Retinitis pigmentosis Gradually worsened since 1984

## 2022-05-16 NOTE — Assessment & Plan Note (Addendum)
Replace 45 mmol of IV K-Phos today. 

## 2022-05-16 NOTE — Assessment & Plan Note (Addendum)
With anxiety - Resumed home clorazepate (Tranxene) 3.75 mg tablet daily for 05/17/2022, perphenazine-amitriptyline per home administration, sertraline 150 milligrams daily resumed

## 2022-05-16 NOTE — Assessment & Plan Note (Addendum)
-   cont Levothyroxine 75 mcg daily

## 2022-05-16 NOTE — ED Triage Notes (Signed)
Patient to ED via [facility] from home for generalized weakness x1 month. Patient was diagnosis with c diff last Sunday and been having some nausea but denies diarrhea. Pt sent home on po antibiotics and has been taking as prescribed as approx 1 week left. Patient c/o middle abd pain. Patient is legally blind.

## 2022-05-16 NOTE — Assessment & Plan Note (Addendum)
PT/OT --SNF rehab

## 2022-05-16 NOTE — Assessment & Plan Note (Addendum)
Type 2 diabetes mellitus with hyperglycemia. --Last hemoglobin A1c elevated 8.5.  Take metformin and glipizide at home. --ACHS and SSI --resumed home po  meds (pt and family prefers)

## 2022-05-16 NOTE — ED Notes (Signed)
Purewick placed at this time.

## 2022-05-16 NOTE — Assessment & Plan Note (Addendum)
Stable Continue Omeprazole 

## 2022-05-16 NOTE — Hospital Course (Addendum)
Lisa Davis is  a 86 year old female with history of depression, anxiety, non-insulin-dependent diabetes mellitus, hypertension, neuropathy, hypothyroid, GERD, who presents emergency department for chief concerns of generalized weakness and inability to ambulate on her own.  Patient was recently diagnosed with C. difficile colitis on 05/07/2022 and has been prescribed vancomycin p.o. 4 times daily, for 10 days completion on 05/18/2022.  ED treatment: Magnesium 2 g IV one-time dose.  The patient still having diarrhea near the end of her vancomycin course, case discussed with ID pharmacist and will switch over to [facility].  He sent the prescription over to her pharmacy and confirmed cost of only $38.  Replacing magnesium and potassium as needed.

## 2022-05-16 NOTE — ED Provider Notes (Signed)
[facility] Provider Note    Event Date/Time   First MD Initiated Contact with Patient 05/16/22 1150     (approximate)   History   Weakness   HPI  Lisa Davis is a 86 y.o. female with GERD diabetes hypertension depression who comes in with concerns for weakness.  Patient was recently started on medications for C. difficile.  She reports that she has been taking it but is now developed some abdominal pain as well as increasing weakness as well as nausea.  Patient is had recent MRI brain as well as MRI lumbar that have all been reassuring she had ultrasounds that were negative for DVT.  Patient was started on vancomycin 125 4 times daily for 10 days.      Physical Exam   Triage Vital Signs: ED Triage Vitals [05/16/22 1150]  Enc Vitals Group     BP      Pulse      Resp      Temp      Temp src      SpO2 95 %     Weight      Height      Head Circumference      Peak Flow      Pain Score      Pain Loc      Pain Edu?      Excl. in GC?     Most recent vital signs: Vitals:   05/16/22 1150  SpO2: 95%     General: Awake, no distress.  CV:  Good peripheral perfusion.  Resp:  Normal effort.  Abd:  No distention.  Tender in the abdomen Other:     ED Results / Procedures / Treatments   Labs (all labs ordered are listed, but only abnormal results are displayed) Labs Reviewed  CBC WITH DIFFERENTIAL/PLATELET - Abnormal; Notable for the following components:      Result Value   Hemoglobin 11.3 (*)    Neutro Abs 8.0 (*)    Abs Immature Granulocytes 0.15 (*)    All other components within normal limits  COMPREHENSIVE METABOLIC PANEL - Abnormal; Notable for the following components:   Glucose, Bld 198 (*)    Calcium 8.5 (*)    Total Protein 6.2 (*)    Albumin 2.7 (*)    All other components within normal limits  MAGNESIUM - Abnormal; Notable for the following components:   Magnesium 1.1 (*)    All other components within normal limits   URINALYSIS, ROUTINE W REFLEX MICROSCOPIC  TROPONIN I (HIGH SENSITIVITY)  TROPONIN I (HIGH SENSITIVITY)     EKG  My interpretation of EKG:  Normal sinus rate of 82 without any ST elevation or T wave inversions, normal intervals  RADIOLOGY I have reviewed the CT head personally interpreted no evidence of any cranial hemorrhage   PROCEDURES:  Critical Care performed: No  Procedures   MEDICATIONS ORDERED IN ED: Medications - No data to display   IMPRESSION / MDM / ASSESSMENT AND PLAN / ED COURSE  I reviewed the triage vital signs and the nursing notes.   Patient's presentation is most consistent with acute presentation with potential threat to life or bodily function.   Patient comes in with increasing weakness and abdominal pain in the setting of being treated for C. difficile.  Labs ordered evaluate for any Electra abnormalities, AKI.  Will get CT imaging due to weakness to make sure no evidence of new intracranial hemorrhage as well as  to make sure no evidence of acute abdominal pathology.  Troponin is negative.  CBC shows hemoglobin that is stable.  CMP overall reassuring.  Mag is low at 1.1.  Will give some IV repletion.  Patient be handed off to oncoming team pending CT imaging and final disposition  The patient is on the cardiac monitor to evaluate for evidence of arrhythmia and/or significant heart rate changes.      FINAL CLINICAL IMPRESSION(S) / ED DIAGNOSES   Final diagnoses:  Weakness  Hypomagnesemia  Clostridioides difficile diarrhea     Rx / DC Orders   ED Discharge Orders     None        Note:  This document was prepared using Dragon voice recognition software and may include unintentional dictation errors.   Concha Se, MD 05/16/22 1420

## 2022-05-16 NOTE — Assessment & Plan Note (Addendum)
Patient still having diarrhea being near the end of her p.o. vancomycin course.  Case discussed with ID pharmacist and will switch over to [facility].  Cost is only to be $30 and this was sent into her pharmacy. --cont [facility]

## 2022-05-16 NOTE — H&P (Signed)
History and Physical   Lisa Davis SWN:[id number] DOB: 05-26-1936 DOA: 05/16/2022  PCP: Vernona Rieger, MD  Outpatient Specialists: Dr. Norma Fredrickson, GI Patient coming from: Home via EMS  I have personally briefly reviewed patient's old medical records in [facility] EMR.  Chief Concern: Generalized weakness  HPI: [name] is  a 86 year old female with history of depression, anxiety, non-insulin-dependent diabetes mellitus, hypertension, neuropathy, hypothyroid, GERD, who presents emergency department for chief concerns of generalized weakness and inability to ambulate on her own.  Patient was recently diagnosed with C. difficile colitis on 05/07/2022 and has been prescribed vancomycin p.o. 4 times daily, for 10 days completion on 05/18/2022.  Initial vitals in the emergency department showed temperature of 97.9, respiration rate of 18, heart rate of 80, blood pressure 141/65, SpO2 100% on room air.  Serum sodium is 140, potassium 3.8, chloride 106, bicarb 25, BUN of 11, serum creatinine of 0.76, GFR greater than 60, WBC 9.9, hemoglobin 11.3, platelets of 312.  High-sensitivity troponin was 10.  Magnesium was 1.1.  ED treatment: Magnesium 2 g IV one-time dose.  At bedside, she is able to tell me her name, age, current calendar year.   She lives with daughter, Lisa Davis. Daughter states that patient has been very week and that patient nearly fell if Lisa Davis was not there to help patient.  Daughter denies patient actually falling or head trauma.  At bedside, she reports the [place] and mashed potatoes tasted okay.  Patient denies known fever, vomiting, chest pain, shortness of breath.  Social history: She lives with Lisa Davis. She denies tobacco, etoh, recreational drug use.   ROS: Constitutional: no weight change, no fever ENT/Mouth: no sore throat, no rhinorrhea Eyes: no eye pain, no vision changes Cardiovascular: no chest pain, no dyspnea,  no edema, no palpitations Respiratory:  no cough, no sputum, no wheezing Gastrointestinal: + nausea, no vomiting, + diarrhea, no constipation Genitourinary: no urinary incontinence, no dysuria, no hematuria Musculoskeletal: no arthralgias, no myalgias Skin: no skin lesions, no pruritus, Neuro: + weakness, no loss of consciousness, no syncope Psych: no anxiety, no depression, + decrease appetite Heme/Lymph: no bruising, no bleeding  ED Course: Discussed with emergency medicine provider, patient requiring hospitalization for chief concerns of weakness and inability to ambulate on her own.  Assessment/Plan  Principal Problem:   Weakness Active Problems:   Diabetes mellitus type 2, noninsulin dependent (HCC)   Essential hypertension   Hypothyroidism   GERD (gastroesophageal reflux disease)   Generalized anxiety disorder   Major depressive disorder   Dyslipidemia   C. difficile colitis   Hypomagnesemia   Legally blind   Hypophosphatemia   Assessment and Plan:  * Weakness - Fall precaution, PT, OT, TOC - Check B12 and magnesium in the a.m.  Hypophosphatemia - Replace with IV phosphorus - Check phosphorus in the a.m.  Legally blind Retinitis pigmentosis Gradually worsened since 1984  Hypomagnesemia - Presumed secondary to GI loss, and status post magnesium 2 g IV per EDP - Recheck magnesium in the a.m.  C. difficile colitis - Toxigenic C. difficile by PCR was positive with C. difficile antigen positive (C. difficile toxin negative) - Per outpatient GI note on 05/08/2022: Patient was symptomatic with reported severe diarrhea and weakness Friday, 05/04/2022.  Patient was prescribed vancomycin 125 mg 4 times daily for 10 days course guarding on 05/08/2022 and will be completed on 05/18/2022.  Major depressive disorder With anxiety - Resumed home clorazepate (Tranxene) 3.75 mg tablet daily for 05/17/2022, perphenazine-amitriptyline per home administration,  sertraline 150 milligrams daily resumed  GERD (gastroesophageal  reflux disease) - PPI resumed  Hypothyroidism - Levothyroxine 75 mcg daily resumed  Essential hypertension - Resumed home losartan 25 mg daily, propranolol 20 mg p.o. twice daily  Diabetes mellitus type 2, noninsulin dependent (HCC) - Metformin 500 mg p.o. twice daily and glipizide 5 mg daily have not been resumed on admission - Insulin SSI with at bedtime coverage ordered - Goal inpatient blood glucose level is 140-180  Chart reviewed.   DVT prophylaxis: Enoxaparin Code Status: full code Diet: Heart healthy/carb modified Family Communication: Updated daughter, Lisa Davis Disposition Plan: Pending clinical course Consults called: TOC, PT, OT Admission status: MedSurg, observation  Past Medical History:  Diagnosis Date   Diabetes mellitus (HCC)    Hypertension    History reviewed. No pertinent surgical history.  Social History:  reports that she has never smoked. She has never used smokeless tobacco. She reports that she does not drink alcohol and does not use drugs.  Allergies  Allergen Reactions   Chlorhexidine Rash   Chlorhexidine Gluconate Hives and Rash   Meperidine Nausea And Vomiting and Palpitations    Tachycardia Tachycardia Tachycardia    Statins Other (See Comments)    Intol to several   Meperidine Hcl     Tachycardia   Codeine Nausea And Vomiting   Metformin And Related Diarrhea   Morphine And Related Nausea And Vomiting   Pedi-Pre Tape Spray [Wound Dressing Adhesive] Rash   Povidone Iodine Rash   History reviewed. No pertinent family history. Family history: Family history reviewed and not pertinent.  Prior to Admission medications   Medication Sig Start Date End Date Taking? Authorizing Provider  acetaminophen (TYLENOL) 325 MG tablet Take by mouth.   Yes [provider]  Calcium Carb-Cholecalciferol 600-10 MG-MCG TABS Take 1 tablet by mouth daily. 08/11/21  Yes [provider]  clorazepate (TRANXENE) 3.75 MG tablet Take by mouth.  03/19/19  Yes [provider]  glipiZIDE (GLUCOTROL) 5 MG tablet Take by mouth.   Yes [provider]  latanoprost (XALATAN) 0.005 % ophthalmic solution Place 1 drop into both eyes at bedtime. 05/28/15  Yes [provider]  losartan (COZAAR) 25 MG tablet Take 25 mg by mouth daily. 07/13/21  Yes [provider]  metFORMIN (GLUCOPHAGE) 500 MG tablet Take 1 tablet by mouth 2 (two) times daily with a meal. 12/01/19  Yes [provider]  pantoprazole (PROTONIX) 40 MG tablet Take 40 mg by mouth daily. 08/25/15  Yes [provider]  perphenazine-amitriptyline (ETRAFON/TRIAVIL) 2-25 MG TABS tablet Take by mouth. 03/08/15  Yes [provider]  propranolol (INDERAL) 20 MG tablet Take 1 tablet by mouth 2 (two) times daily. 06/04/16  Yes [provider]  sertraline (ZOLOFT) 100 MG tablet Take 1.5 tablets by mouth daily. 06/15/15  Yes [provider]  traMADol (ULTRAM) 50 MG tablet Take 50 mg by mouth in the morning and at bedtime.   Yes [provider]  vancomycin (VANCOCIN) 250 MG capsule Take 1 capsule (250 mg total) by mouth 4 (four) times daily for 10 days. 05/08/22 05/18/22 Yes Bradler, Clent Jacks, MD  zolpidem (AMBIEN CR) 6.25 MG CR tablet Take by mouth. 05/23/15  Yes [provider]  celecoxib (CELEBREX) 100 MG capsule Take by mouth. Patient not taking: Reported on 05/16/2022 08/11/21   [provider]  clobetasol cream (TEMOVATE) 0.05 % Apply topically. Patient not taking: Reported on 05/16/2022 04/21/15   [provider]  cyanocobalamin (,VITAMIN B-12,)  1000 MCG/ML injection Inject 1,000 mcg into the muscle once a week. 12/23/20   [provider]  cyclobenzaprine (FLEXERIL) 10 MG tablet Take 1 tablet by mouth 3 (three) times daily as needed. Patient not taking: Reported on 05/16/2022 08/01/21   [provider]  fluticasone (FLONASE) 50 MCG/ACT nasal spray Place into the nose. 02/10/21    [provider]  gabapentin (NEURONTIN) 300 MG capsule Take 1 capsule by mouth 3 (three) times daily. Patient not taking: Reported on 05/16/2022 12/23/20   [provider]  levothyroxine (SYNTHROID) 100 MCG tablet Take by mouth.    [provider]  levothyroxine (SYNTHROID) 75 MCG tablet Take 75 mcg by mouth daily before breakfast. 03/10/20   [provider]  meclizine (ANTIVERT) 25 MG tablet Take 25 mg by mouth 2 (two) times daily. 06/10/12   [provider]  meloxicam (MOBIC) 7.5 MG tablet Take 7.5 mg by mouth daily. Patient not taking: Reported on 05/16/2022 05/02/21   [provider]   Physical Exam: Vitals:   05/16/22 1430 05/16/22 1500 05/16/22 1808 05/16/22 1819  BP: 136/62 126/65 (!) 139/58   Pulse: 76 73 87   Resp: 18 (!) 24 18   Temp:   97.9 F (36.6 C)   TempSrc:      SpO2: 98% 98% 90%   Weight:    64.7 kg  Height:    5\' 3"  (1.6 m)   Constitutional: appears age appropriate, frail, NAD, calm, comfortable Eyes: Bilateral legal blindness, lids and conjunctivae normal ENMT: Mucous membranes are moist. Posterior pharynx clear of any exudate or lesions. Age-appropriate dentition. Hearing appropriate Neck: normal, supple, no masses, no thyromegaly Respiratory: clear to auscultation bilaterally, no wheezing, no crackles. Normal respiratory effort. No accessory muscle use.  Cardiovascular: Regular rate and rhythm, no murmurs / rubs / gallops. No extremity edema. 2+ pedal pulses. No carotid bruits.  Abdomen: no tenderness, no masses palpated, no hepatosplenomegaly. Bowel sounds positive.  Musculoskeletal: no clubbing / cyanosis. No joint deformity upper and lower extremities. Good ROM, no contractures, no atrophy. Normal muscle tone.  Skin: no rashes, lesions, ulcers. No induration Neurologic: Sensation intact. Strength 5/5 in all 4.  Psychiatric: Normal judgment and insight. Alert and oriented x 3. Normal mood.   EKG: independently  reviewed, showing sinus rhythm with rate of 82, QTc 427  Chest x-ray on Admission: Not indicated  CT ABDOMEN PELVIS W CONTRAST  Result Date: 05/16/2022 CLINICAL DATA:  Abdominal pain. EXAM: CT ABDOMEN AND PELVIS WITH CONTRAST TECHNIQUE: Multidetector CT imaging of the abdomen and pelvis was performed using the standard protocol following bolus administration of intravenous contrast. RADIATION DOSE REDUCTION: This exam was performed according to the departmental dose-optimization program which includes automated exposure control, adjustment of the mA and/or kV according to patient size and/or use of iterative reconstruction technique. CONTRAST:  07/16/2022 OMNIPAQUE IOHEXOL 300 MG/ML  SOLN COMPARISON:  May 06, 2022 FINDINGS: Lower chest: Mosaic attenuation of the lung parenchyma, usually associated with small airway disease. Hepatobiliary: No focal liver abnormality is seen. Status post cholecystectomy. No biliary dilatation. Pancreas: Unremarkable. No pancreatic ductal dilatation or surrounding inflammatory changes. Spleen: Normal in size without focal abnormality. Adrenals/Urinary Tract: Normal adrenal glands. Normal right kidney. 5 mm nonobstructive calculus in the lower pole of the left kidney. No evidence of hydronephrosis. Normal ureters and urinary bladder. Stomach/Bowel: Stomach is within normal limits. No evidence of appendicitis. No evidence of bowel wall thickening, distention, or inflammatory changes. Left colonic diverticulosis without evidence diverticulitis. Vascular/Lymphatic:  No significant vascular findings are present. No enlarged abdominal or pelvic lymph nodes. Reproductive: Status post hysterectomy. No adnexal masses. Other: No abdominal wall hernia or abnormality. No abdominopelvic ascites. Musculoskeletal: Severe compression deformity of L1 vertebral body with approximately 85% height loss centrally. Again seen is 8 mm retropulsion of fracture fragments into the spinal canal. Diffuse  spondylosis of the lumbosacral spine. IMPRESSION: 1. No acute abnormalities within the abdomen or pelvis. 2. Left colonic diverticulosis without evidence of diverticulitis. 3. 5 mm nonobstructive calculus in the lower pole of the left kidney. 4. Severe compression deformity of L1 vertebral body with approximately 85% height loss centrally, stable from the CT dated May 06, 2022. Again seen is 8 mm retropulsion of fracture fragments into the spinal canal. Electronically Signed   By: Ted Mcalpine M.D.   On: 05/16/2022 14:29   CT HEAD WO CONTRAST ( )  Result Date: 05/16/2022 CLINICAL DATA:  Headache, sudden, severe EXAM: CT HEAD WITHOUT CONTRAST TECHNIQUE: Contiguous axial images were obtained from the base of the skull through the vertex without intravenous contrast. RADIATION DOSE REDUCTION: This exam was performed according to the departmental dose-optimization program which includes automated exposure control, adjustment of the mA and/or kV according to patient size and/or use of iterative reconstruction technique. COMPARISON:  None Available. FINDINGS: Brain: No evidence of acute infarction, hemorrhage, hydrocephalus, extra-axial collection or mass lesion/mass effect. Partially empty sella. Vascular: No hyperdense vessel identified. Skull: No acute fracture.  Hyperostosis frontalis. Sinuses/Orbits: Clear sinuses.  No acute orbital findings. Other: No mastoid effusions. IMPRESSION: No evidence of acute intracranial abnormality. Electronically Signed   By: Feliberto Harts M.D.   On: 05/16/2022 14:23    Labs on Admission: I have personally reviewed following labs  CBC: Recent Labs  Lab 05/16/22 1202  WBC 9.9  NEUTROABS 8.0*  HGB 11.3*  HCT 36.1  MCV 90.5  PLT 312   Basic Metabolic Panel: Recent Labs  Lab 05/16/22 1202 05/16/22 1840  NA 140  --   K 3.8  --   CL 106  --   CO2 25  --   GLUCOSE 198*  --   BUN 11  --   CREATININE 0.76  --   CALCIUM 8.5*  --   MG 1.1*  --   PHOS   --  2.0*   GFR: Estimated Creatinine Clearance: 47.4 mL/min (by C-G formula based on SCr of 0.76 mg/dL).  Liver Function Tests: Recent Labs  Lab 05/16/22 1202  AST 26  ALT 13  ALKPHOS 81  BILITOT 0.9  PROT 6.2*  ALBUMIN 2.7*   Urine analysis:    Component Value Date/Time   COLORURINE YELLOW (A) 05/06/2022 2059   APPEARANCEUR HAZY (A) 05/06/2022 2059   APPEARANCEUR Clear 04/26/2022 1500   LABSPEC 1.036 (H) 05/06/2022 2059   PHURINE 5.0 05/06/2022 2059   GLUCOSEU NEGATIVE 05/06/2022 2059   HGBUR NEGATIVE 05/06/2022 2059   BILIRUBINUR NEGATIVE 05/06/2022 2059   BILIRUBINUR Negative 04/26/2022 1500   KETONESUR NEGATIVE 05/06/2022 2059   PROTEINUR NEGATIVE 05/06/2022 2059   NITRITE NEGATIVE 05/06/2022 2059   LEUKOCYTESUR SMALL (A) 05/06/2022 2059   Dr. Sedalia Muta [facility]  If 7PM-7AM, please contact overnight-coverage provider If 7AM-7PM, please contact day coverage provider [email or web address]  05/16/2022, 7:30 PM

## 2022-05-16 NOTE — Assessment & Plan Note (Signed)
-   Resumed home losartan 25 mg daily, propranolol 20 mg p.o. twice daily

## 2022-05-17 ENCOUNTER — Telehealth (HOSPITAL_COMMUNITY): Payer: Self-pay | Admitting: Pharmacy Technician

## 2022-05-17 ENCOUNTER — Inpatient Hospital Stay: Payer: Medicare Other

## 2022-05-17 ENCOUNTER — Other Ambulatory Visit (HOSPITAL_COMMUNITY): Payer: Self-pay

## 2022-05-17 DIAGNOSIS — J9601 Acute respiratory failure with hypoxia: Secondary | ICD-10-CM

## 2022-05-17 DIAGNOSIS — E039 Hypothyroidism, unspecified: Secondary | ICD-10-CM

## 2022-05-17 DIAGNOSIS — E876 Hypokalemia: Secondary | ICD-10-CM | POA: Diagnosis present

## 2022-05-17 DIAGNOSIS — Z888 Allergy status to other drugs, medicaments and biological substances status: Secondary | ICD-10-CM | POA: Diagnosis not present

## 2022-05-17 DIAGNOSIS — R338 Other retention of urine: Secondary | ICD-10-CM

## 2022-05-17 DIAGNOSIS — Z90710 Acquired absence of both cervix and uterus: Secondary | ICD-10-CM | POA: Diagnosis not present

## 2022-05-17 DIAGNOSIS — H548 Legal blindness, as defined in USA: Secondary | ICD-10-CM | POA: Diagnosis present

## 2022-05-17 DIAGNOSIS — Z7984 Long term (current) use of oral hypoglycemic drugs: Secondary | ICD-10-CM | POA: Diagnosis not present

## 2022-05-17 DIAGNOSIS — R339 Retention of urine, unspecified: Secondary | ICD-10-CM | POA: Diagnosis present

## 2022-05-17 DIAGNOSIS — Z96641 Presence of right artificial hip joint: Secondary | ICD-10-CM | POA: Diagnosis present

## 2022-05-17 DIAGNOSIS — I959 Hypotension, unspecified: Secondary | ICD-10-CM | POA: Diagnosis present

## 2022-05-17 DIAGNOSIS — F411 Generalized anxiety disorder: Secondary | ICD-10-CM | POA: Diagnosis present

## 2022-05-17 DIAGNOSIS — K219 Gastro-esophageal reflux disease without esophagitis: Secondary | ICD-10-CM | POA: Diagnosis present

## 2022-05-17 DIAGNOSIS — F329 Major depressive disorder, single episode, unspecified: Secondary | ICD-10-CM | POA: Diagnosis present

## 2022-05-17 DIAGNOSIS — Z9049 Acquired absence of other specified parts of digestive tract: Secondary | ICD-10-CM | POA: Diagnosis not present

## 2022-05-17 DIAGNOSIS — A0472 Enterocolitis due to Clostridium difficile, not specified as recurrent: Secondary | ICD-10-CM

## 2022-05-17 DIAGNOSIS — E1165 Type 2 diabetes mellitus with hyperglycemia: Secondary | ICD-10-CM

## 2022-05-17 DIAGNOSIS — Z79899 Other long term (current) drug therapy: Secondary | ICD-10-CM | POA: Diagnosis not present

## 2022-05-17 DIAGNOSIS — E785 Hyperlipidemia, unspecified: Secondary | ICD-10-CM | POA: Diagnosis present

## 2022-05-17 DIAGNOSIS — R531 Weakness: Secondary | ICD-10-CM | POA: Diagnosis present

## 2022-05-17 DIAGNOSIS — I10 Essential (primary) hypertension: Secondary | ICD-10-CM | POA: Diagnosis present

## 2022-05-17 DIAGNOSIS — Z79890 Hormone replacement therapy (postmenopausal): Secondary | ICD-10-CM | POA: Diagnosis not present

## 2022-05-17 LAB — HEMOGLOBIN A1C
Hgb A1c MFr Bld: 8.5 % — ABNORMAL HIGH (ref 4.8–5.6)
Mean Plasma Glucose: 197.25 mg/dL

## 2022-05-17 LAB — GLUCOSE, CAPILLARY
Glucose-Capillary: 205 mg/dL — ABNORMAL HIGH (ref 70–99)
Glucose-Capillary: 193 mg/dL — ABNORMAL HIGH (ref 70–99)
Glucose-Capillary: 241 mg/dL — ABNORMAL HIGH (ref 70–99)
Glucose-Capillary: 209 mg/dL — ABNORMAL HIGH (ref 70–99)
Glucose-Capillary: 152 mg/dL — ABNORMAL HIGH (ref 70–99)

## 2022-05-17 LAB — CBC
HCT: 34.1 % — ABNORMAL LOW (ref 36.0–46.0)
Hemoglobin: 11 g/dL — ABNORMAL LOW (ref 12.0–15.0)
MCH: 28.6 pg (ref 26.0–34.0)
MCHC: 32.3 g/dL (ref 30.0–36.0)
MCV: 88.6 fL (ref 80.0–100.0)
Platelets: 309 10*3/uL (ref 150–400)
RBC: 3.85 MIL/uL — ABNORMAL LOW (ref 3.87–5.11)
RDW: 15.2 % (ref 11.5–15.5)
WBC: 14.5 10*3/uL — ABNORMAL HIGH (ref 4.0–10.5)
nRBC: 0 % (ref 0.0–0.2)

## 2022-05-17 LAB — BASIC METABOLIC PANEL
Anion gap: 9 (ref 5–15)
BUN: 13 mg/dL (ref 8–23)
CO2: 25 mmol/L (ref 22–32)
Calcium: 8.2 mg/dL — ABNORMAL LOW (ref 8.9–10.3)
Chloride: 105 mmol/L (ref 98–111)
Creatinine, Ser: 0.8 mg/dL (ref 0.44–1.00)
GFR, Estimated: >60 mL/min (ref >60)
Glucose, Bld: 258 mg/dL — ABNORMAL HIGH (ref 70–99)
Potassium: 2.8 mmol/L — ABNORMAL LOW (ref 3.5–5.1)
Sodium: 139 mmol/L (ref 135–145)

## 2022-05-17 LAB — MAGNESIUM: Magnesium: 1.5 mg/dL — ABNORMAL LOW (ref 1.7–2.4)

## 2022-05-17 LAB — PHOSPHORUS: Phosphorus: 4.9 mg/dL — ABNORMAL HIGH (ref 2.5–4.6)

## 2022-05-17 MED ORDER — SODIUM CHLORIDE 0.9 % IV SOLN: INTRAVENOUS | Status: DC

## 2022-05-17 MED ORDER — INSULIN GLARGINE-YFGN 100 UNIT/ML ~~LOC~~ SOLN
6.0000 [IU] | Freq: Every day | SUBCUTANEOUS | Status: DC
Start: 2022-05-17 — End: 2022-05-20
  Administered 2022-05-17 – 2022-05-19 (×3): 6 [IU] via SUBCUTANEOUS
  Filled 2022-05-17 (×3): qty 0.06

## 2022-05-17 MED ORDER — DIFICID 200 MG PO TABS
200.0000 mg | ORAL_TABLET | Freq: Two times a day (BID) | ORAL | 0 refills | Status: DC
Start: 2022-05-17 — End: 2022-05-21

## 2022-05-17 MED ORDER — SODIUM CHLORIDE 0.9 % IV BOLUS
250.0000 mL | Freq: Once | INTRAVENOUS | Status: AC
  Administered 2022-05-17: 250 mL via INTRAVENOUS

## 2022-05-17 MED ORDER — BUDESONIDE 0.25 MG/2ML IN SUSP
0.2500 mg | Freq: Two times a day (BID) | RESPIRATORY_TRACT | Status: DC
  Administered 2022-05-17 – 2022-05-21 (×9): 0.25 mg via RESPIRATORY_TRACT
  Filled 2022-05-17 (×9): qty 2

## 2022-05-17 MED ORDER — FIDAXOMICIN 200 MG PO TABS
200.0000 mg | ORAL_TABLET | Freq: Two times a day (BID) | ORAL | Status: DC
  Administered 2022-05-17 – 2022-05-21 (×9): 200 mg via ORAL
  Filled 2022-05-17 (×9): qty 1

## 2022-05-17 MED ORDER — POTASSIUM CHLORIDE 10 MEQ/100ML IV SOLN
10.0000 meq | INTRAVENOUS | Status: AC
  Administered 2022-05-17 (×3): 10 meq via INTRAVENOUS
  Filled 2022-05-17 (×3): qty 100

## 2022-05-17 MED ORDER — MAGNESIUM SULFATE 4 GM/100ML IV SOLN
4.0000 g | Freq: Once | INTRAVENOUS | Status: AC
  Administered 2022-05-17: 4 g via INTRAVENOUS
  Filled 2022-05-17: qty 100

## 2022-05-17 MED ORDER — PROSOURCE PLUS PO LIQD
30.0000 mL | Freq: Three times a day (TID) | ORAL | Status: DC
  Administered 2022-05-17 – 2022-05-21 (×7): 30 mL via ORAL
  Filled 2022-05-17: qty 30

## 2022-05-17 MED ORDER — IPRATROPIUM-ALBUTEROL 0.5-2.5 (3) MG/3ML IN SOLN
3.0000 mL | Freq: Four times a day (QID) | RESPIRATORY_TRACT | Status: DC
  Administered 2022-05-17 (×2): 3 mL via RESPIRATORY_TRACT
  Filled 2022-05-17 (×2): qty 3

## 2022-05-17 MED ORDER — ENOXAPARIN SODIUM 40 MG/0.4ML IJ SOSY
40.0000 mg | PREFILLED_SYRINGE | INTRAMUSCULAR | Status: DC
  Administered 2022-05-17 – 2022-05-20 (×4): 40 mg via SUBCUTANEOUS
  Filled 2022-05-17 (×4): qty 0.4

## 2022-05-17 MED ORDER — ADULT MULTIVITAMIN W/MINERALS CH
1.0000 | ORAL_TABLET | Freq: Every day | ORAL | Status: DC
  Administered 2022-05-17 – 2022-05-21 (×5): 1 via ORAL
  Filled 2022-05-17 (×5): qty 1

## 2022-05-17 MED ORDER — BOOST / RESOURCE BREEZE PO LIQD CUSTOM
1.0000 | Freq: Three times a day (TID) | ORAL | Status: DC
  Administered 2022-05-17 – 2022-05-21 (×5): 1 via ORAL

## 2022-05-17 MED ORDER — POTASSIUM CHLORIDE CRYS ER 20 MEQ PO TBCR
40.0000 meq | EXTENDED_RELEASE_TABLET | Freq: Two times a day (BID) | ORAL | Status: DC
  Administered 2022-05-17 – 2022-05-18 (×3): 40 meq via ORAL
  Filled 2022-05-17 (×3): qty 2

## 2022-05-17 MED ORDER — IPRATROPIUM-ALBUTEROL 0.5-2.5 (3) MG/3ML IN SOLN
3.0000 mL | Freq: Two times a day (BID) | RESPIRATORY_TRACT | Status: DC
  Administered 2022-05-17 – 2022-05-20 (×7): 3 mL via RESPIRATORY_TRACT
  Filled 2022-05-17 (×7): qty 3

## 2022-05-17 NOTE — Assessment & Plan Note (Addendum)
Pulse ox of 86%.  Unclear etiology.  Not on Home O2.   Started budesonide and DuoNeb nebulizer treatments. --Continue supplemental O2 to keep sats >=90%, wean as tolerated --resolved

## 2022-05-17 NOTE — Assessment & Plan Note (Addendum)
Improved with IVF. Plan: --encourage oral hydration Hold antihypertensive medications for now

## 2022-05-17 NOTE — Telephone Encounter (Signed)
Pharmacy Patient Advocate Encounter  Insurance verification completed.    The patient is insured through [employer]   The patient is currently admitted and ran test claims for the following: [employer].  Copays and coinsurance results were relayed to Inpatient clinical team.  

## 2022-05-17 NOTE — Evaluation (Signed)
Occupational Therapy Evaluation Patient Details Name: Kathalene Sporer MRN: [id number] DOB: [birth year] Today's Date: 05/17/2022   History of Present Illness Ms. Mariellen Blaney is  a 86 year old female with history of depression, anxiety, non-insulin-dependent diabetes mellitus, hypertension, neuropathy, hypothyroid, GERD, who presents emergency department for chief concerns of generalized weakness and inability to ambulate on her own.   Clinical Impression   Ms Antu was seen for OT evaluation this date. Prior to hospital admission, pt was MOD I for mobility and assist for ADLs. Pt lives with daughter. Pt presents to acute OT demonstrating impaired ADL performance and functional mobility 2/2 decreased activity tolerance and functional strength/ROM/balance deficits. Pt currently requires MIN A rolling L+R for pericare at bed level. MOD A bed mobility, poor sitting tolerance. Pt self-initiates return to sitting, attempted to encourage standing with pt becoming anxious and fearful of falling - cleared rear x2 with MAX A sit<>stand. Returned to bed level with family in room. Pt would benefit from skilled OT to address noted impairments and functional limitations (see below for any additional details). Upon hospital discharge, recommend STR to maximize pt safety and return to PLOF.    Recommendations for follow up therapy are one component of a multi-disciplinary discharge planning process, led by the attending physician.  Recommendations may be updated based on patient status, additional functional criteria and insurance authorization.   Follow Up Recommendations  Skilled nursing-short term rehab (<3 hours/day)    Assistance Recommended at Discharge Frequent or constant Supervision/Assistance  Patient can return home with the following Two people to help with walking and/or transfers;A lot of help with bathing/dressing/bathroom    Functional Status Assessment  Patient has had a recent  decline in their functional status and demonstrates the ability to make significant improvements in function in a reasonable and predictable amount of time.  Equipment Recommendations  Other (comment) (defer to next venue of care)    Recommendations for Other Services       Precautions / Restrictions Precautions Precautions: Fall Restrictions Weight Bearing Restrictions: No      Mobility Bed Mobility Overal bed mobility: Needs Assistance Bed Mobility: Rolling, Supine to Sit, Sit to Supine Rolling: Min assist   Supine to sit: Mod assist, HOB elevated Sit to supine: Mod assist        Transfers Overall transfer level: Needs assistance   Transfers: Sit to/from Stand Sit to Stand: Max assist           General transfer comment: pt is anxious and fearful of standing      Balance Overall balance assessment: Needs assistance Sitting-balance support: No upper extremity supported, Feet supported Sitting balance-Leahy Scale: Fair     Standing balance support: Bilateral upper extremity supported Standing balance-Leahy Scale: Poor                             ADL either performed or assessed with clinical judgement   ADL Overall ADL's : Needs assistance/impaired                                       General ADL Comments: MIN A rolling L+R for pericare at bed level. MAX A don B socks in sitting.      Pertinent Vitals/Pain Pain Assessment Pain Assessment: Faces Faces Pain Scale: Hurts a little bit Pain Location: general Pain Descriptors / Indicators: Discomfort Pain Intervention(s): Limited  activity within patient's tolerance     Hand Dominance     Extremity/Trunk Assessment Upper Extremity Assessment Upper Extremity Assessment: Generalized weakness   Lower Extremity Assessment Lower Extremity Assessment: Generalized weakness       Communication Communication Communication: HOH   Cognition Arousal/Alertness:  Awake/alert Behavior During Therapy: WFL for tasks assessed/performed Overall Cognitive Status: Within Functional Limits for tasks assessed                                        Home Living Family/patient expects to be discharged to:: Private residence Living Arrangements: Children Available Help at Discharge: Family;Available 24 hours/day Type of Home: Apartment       Home Layout: One level               Home Equipment: Agricultural consultant (2 wheels)          Prior Functioning/Environment Prior Level of Function : Needs assist             Mobility Comments: MOD I, limited mobility with RW use ADLs Comments: assist bathing        OT Problem List: Decreased strength;Decreased range of motion;Decreased activity tolerance;Impaired balance (sitting and/or standing);Decreased safety awareness      OT Treatment/Interventions: Self-care/ADL training;Therapeutic exercise;Energy conservation;DME and/or AE instruction;Therapeutic activities;Patient/family education;Balance training    OT Goals(Current goals can be found in the care plan section) Acute Rehab OT Goals Patient Stated Goal: to go home OT Goal Formulation: With patient/family Time For Goal Achievement: 05/31/22 Potential to Achieve Goals: Good ADL Goals Pt Will Perform Grooming: with modified independence;sitting (will tolerate >10 mins) Pt Will Perform Lower Body Dressing: with min assist;sit to/from stand Pt Will Transfer to Toilet: with min assist;ambulating;bedside commode  OT Frequency: Min 2X/week    Co-evaluation              AM-PAC OT "6 Clicks" Daily Activity     Outcome Measure Help from another person eating meals?: A Little Help from another person taking care of personal grooming?: A Little Help from another person toileting, which includes using toliet, bedpan, or urinal?: A Lot Help from another person bathing (including washing, rinsing, drying)?: A Lot Help from  another person to put on and taking off regular upper body clothing?: A Little Help from another person to put on and taking off regular lower body clothing?: A Lot 6 Click Score: 15   End of Session Nurse Communication: Mobility status  Activity Tolerance: Patient tolerated treatment well Patient left: in bed;with call bell/phone within reach;with family/visitor present  OT Visit Diagnosis: Muscle weakness (generalized) (M62.81)                Time: 1050-1107 OT Time Calculation (min): 17 min Charges:  OT General Charges $OT Visit: 1 Visit OT Evaluation $OT Eval Moderate Complexity: 1 Mod  Kathie Dike, M.S. OTR/L  05/17/22, 12:22 PM  ascom [phone]

## 2022-05-17 NOTE — Assessment & Plan Note (Addendum)
monitor

## 2022-05-17 NOTE — Progress Notes (Signed)
Progress Note   Patient: Lisa Davis EGB:[id number] DOB: 1936-03-21 DOA: 05/16/2022     0 DOS: the patient was seen and examined on 05/17/2022   Brief hospital course: Ms. Felicity Penix is  a 86 year old female with history of depression, anxiety, non-insulin-dependent diabetes mellitus, hypertension, neuropathy, hypothyroid, GERD, who presents emergency department for chief concerns of generalized weakness and inability to ambulate on her own.  Patient was recently diagnosed with C. difficile colitis on 05/07/2022 and has been prescribed vancomycin p.o. 4 times daily, for 10 days completion on 05/18/2022.  Initial vitals in the emergency department showed temperature of 97.9, respiration rate of 18, heart rate of 80, blood pressure 141/65, SpO2 100% on room air.  Serum sodium is 140, potassium 3.8, chloride 106, bicarb 25, BUN of 11, serum creatinine of 0.76, GFR greater than 60, WBC 9.9, hemoglobin 11.3, platelets of 312.  High-sensitivity troponin was 10.  Magnesium was 1.1.  ED treatment: Magnesium 2 g IV one-time dose.  The patient still having diarrhea near the end of her vancomycin course, case discussed with ID pharmacist and will switch over to Dificid.  He sent the prescription over to her pharmacy and confirmed cost of only $38.  Replacing magnesium and potassium today.  Patient also with urinary retention and starting in and out catheterizations.  Blood pressure on the lower side this morning giving fluid bolus and IV fluids and holding antihypertensive medications.  Assessment and Plan: * C. difficile colitis Patient still having diarrhea being near the end of her p.o. vancomycin course.  Case discussed with ID pharmacist and will switch over to Dificid.  Cost is only to be $30 and this was sent into her pharmacy.  Hypotension Hold antihypertensive medications.  Fluid bolus this morning and IV fluids.  Hypomagnesemia 4 g IV magnesium.  Hypokalemia Replace potassium  orally and IV.  Acute respiratory failure with hypoxia (HCC) Pulse ox of 86% and patient is wheezing.  Started budesonide and DuoNeb nebulizer treatments.  Will get an x-ray.  Weakness Will get PT evaluation and OT evaluations.  Major depressive disorder Depression and anxiety.  On Tranxene and Zoloft.  Acute urinary retention In and out catheterizations every 8 hours as needed.  Patient retaining over 400 mL of urine on bladder scan.  Hypophosphatemia Replaced yesterday.  Legally blind Retinitis pigmentosis Gradually worsened since 1984  GERD (gastroesophageal reflux disease) - PPI resumed  Hypothyroidism - Levothyroxine 75 mcg daily resumed  Diabetes mellitus type 2, noninsulin dependent (HCC) Type 2 diabetes mellitus with hyperglycemia.  Holding metformin especially with diarrhea.  Sliding scale insulin for now.  Last hemoglobin A1c elevated 8.5.        Subjective: Patient having some diarrhea.  Was recently diagnosed with C. difficile on oral vancomycin.  Coming in with weakness and falls.  Physical Exam: Vitals:   05/17/22 0753 05/17/22 0825 05/17/22 1037 05/17/22 1039  BP: (!) 97/42 (!) 105/56    Pulse: 91 89    Resp: 20     Temp: 99 F (37.2 C)     TempSrc:      SpO2: 90%  (S) (!) 86% 94%  Weight:      Height:       Physical Exam HENT:     Head: Normocephalic.     Mouth/Throat:     Pharynx: No oropharyngeal exudate.  Eyes:     General: Lids are normal.     Conjunctiva/sclera: Conjunctivae normal.  Cardiovascular:     Rate and Rhythm: Normal  rate and regular rhythm.     Heart sounds: Normal heart sounds, S1 normal and S2 normal.  Pulmonary:     Breath sounds: Examination of the right-middle field reveals wheezing. Examination of the left-middle field reveals wheezing. Examination of the right-lower field reveals decreased breath sounds and wheezing. Examination of the left-lower field reveals decreased breath sounds and wheezing. Decreased breath  sounds and wheezing present. No rhonchi or rales.  Abdominal:     Palpations: Abdomen is soft.     Tenderness: There is no abdominal tenderness.  Musculoskeletal:     Right lower leg: No swelling.     Left lower leg: No swelling.  Skin:    General: Skin is warm.     Findings: No rash.  Neurological:     Mental Status: She is alert and oriented to person, place, and time.     Comments: Patient able to straight leg raise bilaterally without any issue.     Data Reviewed: Potassium 2.8, magnesium 1.5, creatinine 0.8, hemoglobin 11.1, white blood cell count 14.5  Family Communication: Spoke with daughter at the bedside  Disposition: Status is: Inpatient Remains inpatient appropriate because: Patient hypoxic, hypotensive and increasing white count and electrolyte abnormalities of very low potassium and magnesium make it an unsafe discharge at this point.  Changed to inpatient status.  Planned Discharge Destination: Home    Time spent: 29 minutes Case discussed with nursing staff and ID pharmacist.  Author: Alford Highland, MD 05/17/2022 1:03 PM  For on call review [email or web address].

## 2022-05-17 NOTE — Progress Notes (Signed)
Pharmacy - Antimicrobial Stewardship (brief note)  Patient w/ C. Difficile diagnosed as outpatient on vancomycin po caps for ~1 week.  Remains symptomatic.  Checked copay for fidaxomicin at hospitalist request, copay is $38.  Has [employer] which is not accepted at [facility] outpatient pharmacy (only [facility] at [facility] outpatient pharmacies). Called her [facility] pharmacy and they have 18 tabs in stock.  Anticipate will get a least one full day of treatment here so prescription sent to [facility] for that quantity  Juliette Alcide, PharmD, BCPS, BCIDP Work Cell: [phone] 05/17/2022 9:32 AM

## 2022-05-17 NOTE — Assessment & Plan Note (Addendum)
Replace 45 mmol of IV K-Phos today. 

## 2022-05-17 NOTE — Progress Notes (Signed)
Initial Nutrition Assessment  DOCUMENTATION CODES:   Not applicable  INTERVENTION:   -30 ml Prosource Plus TID, each supplement provides 100 kcals and 15 grams protein -Boost Breeze po TID, each supplement provides 250 kcal and 9 grams of protein  -MVI with minerals daily -Downgrade diet to dysphagia 3 for ease of intake  NUTRITION DIAGNOSIS:   Inadequate oral intake related to decreased appetite as evidenced by meal completion < 25%.  GOAL:   Patient will meet greater than or equal to 90% of their needs  MONITOR:   PO intake, Supplement acceptance  REASON FOR ASSESSMENT:   Malnutrition Screening Tool    ASSESSMENT:   Pt with history of depression, anxiety, non-insulin-dependent diabetes mellitus, hypertension, neuropathy, hypothyroid, GERD, who presents for chief concerns of generalized weakness and inability to ambulate on her own.  Pt admitted with weakness, C-diff, and electrolyte imbalance.   Reviewed I/O's: +528 ml x 24 hours  Spoke with pt and daughter at bedside. Pt reports decreased oral intake (drinking mostly water, gatorade, and electrolyte drinks) over the past week. Per daughter, pt's intake usually low at baseline (Breakfast: cereal OR eggs and toast; Lunch: half sandwich; Dinner: meat and vegetable). Pt has tried Ensure in the past, but did not tolerate well. Pt does not have her dentures and is amenable to diet downgrade.   Per daughter, pt has lost about 14 pounds over the past 2 weeks. Her UBW is around 125#. Reviewed wt hx; pt has experienced a 8% wt loss over the past 8 months.   Discussed importance of good meal and supplement intake to promote healing. Pt amenable to Boost Breeze.   Medications reviewed and include vitamin B-12, potassium chloride, IV magnesium , and IV potassium.   Lab Results  Component Value Date   HGBA1C 8.5 (H) 05/17/2022   PTA DM medications are 500 mg metformin BID and 5 mg glipizide daily. Last Hgb A1c: 8.1 on 01/19/22  via [facility]. Per ADA's Standards of Medical Care of Diabetes, glycemic targets for older adults who have multiple co-morbidities, cognitive impairments, and functional dependence should be less stringent (Hgb A1c <8.0-8.5).    Labs reviewed: K: 2.8, Phos: 2.9, Mg: 1.5, CBGS: 138-205 (inpatient orders for glycemic control are 0-15 units insulin aspart TID with meals and 0-5 units insulin aspart daily at bedtime).    NUTRITION - FOCUSED PHYSICAL EXAM:  Flowsheet Row Most Recent Value  Orbital Region No depletion  Upper Arm Region Mild depletion  Thoracic and Lumbar Region No depletion  Buccal Region No depletion  Temple Region Mild depletion  Clavicle Bone Region No depletion  Clavicle and Acromion Bone Region No depletion  Scapular Bone Region No depletion  Dorsal Hand No depletion  Patellar Region No depletion  Anterior Thigh Region No depletion  Posterior Calf Region No depletion  Edema (RD Assessment) None  Hair Reviewed  Eyes Reviewed  Mouth Reviewed  Skin Reviewed  Nails Reviewed       Diet Order:   Diet Order             DIET DYS 3 Room service appropriate? Yes; Fluid consistency: Thin  Diet effective now                   EDUCATION NEEDS:   Education needs have been addressed  Skin:  Skin Assessment: Reviewed RN Assessment  Last BM:  05/17/22 (type 6)  Height:   Ht Readings from Last 1 Encounters:  05/16/22 5\' 3"  (1.6 m)  Weight:   Wt Readings from Last 1 Encounters:  05/16/22 64.7 kg    Ideal Body Weight:  52.3 kg  BMI:  Body mass index is 25.27 kg/m.  Estimated Nutritional Needs:   Kcal:  1550-1750  Protein:  80-95 grams  Fluid:  > 1.5 L    Levada Schilling, RD, LDN, CDCES Registered Dietitian II Certified Diabetes Care and Education Specialist Please refer to [facility] for RD and/or RD on-call/weekend/after hours pager

## 2022-05-17 NOTE — TOC Benefit Eligibility Note (Signed)
Patient Product/process development scientist completed.    The patient is currently admitted and upon discharge could be taking Dificid 200 mg tablets.  The current 10 day co-pay is $38.00.   The patient is insured through [employer]    Roland Earl, CPhT Pharmacy Patient Advocate Specialist [facility] Patient Advocate Team Direct Number: [phone]  Fax: [phone]

## 2022-05-17 NOTE — Evaluation (Signed)
Physical Therapy Evaluation Patient Details Name: Lisa Davis MRN: [id number] DOB: 11/20/1935 Today's Date: 05/17/2022  History of Present Illness  86 year old female with history of depression, anxiety, non-insulin-dependent diabetes mellitus, hypertension, neuropathy, hypothyroid, GERD, who presented wth generalized weakness and inability to ambulate.  Clinical Impression  Pt did relatively well with PT exam, she (and daughter) was surprised at how much she was able to do (walked ~40 ft in the room) but she is still weak and with limited activity tolerance compared to her baseline.  Pt has been to rehab in the past with good functional improvement and reports that she really wants to work hard to get back home safely.  Pt on 2L O2 on arrival, sats stayed in the 90s the entire time on room air.  Pt will benefit from continued PT to address functional limitations listed below.        Recommendations for follow up therapy are one component of a multi-disciplinary discharge planning process, led by the attending physician.  Recommendations may be updated based on patient status, additional functional criteria and insurance authorization.  Follow Up Recommendations Skilled nursing-short term rehab (<3 hours/day) Can patient physically be transported by private vehicle: No    Assistance Recommended at Discharge Frequent or constant Supervision/Assistance  Patient can return home with the following  A little help with walking and/or transfers;A little help with bathing/dressing/bathroom;Assistance with cooking/housework;Assist for transportation;Help with stairs or ramp for entrance    Equipment Recommendations None recommended by PT  Recommendations for Other Services       Functional Status Assessment Patient has had a recent decline in their functional status and demonstrates the ability to make significant improvements in function in a reasonable and predictable amount of time.      Precautions / Restrictions Precautions Precautions: Fall Restrictions Weight Bearing Restrictions: No      Mobility  Bed Mobility Overal bed mobility: Needs Assistance Bed Mobility: Supine to Sit     Supine to sit: Min assist, Mod assist     General bed mobility comments: Pt showed good effort, needed HHA to pull up to sitting but did not need excessive physical assist to actually get up    Transfers Overall transfer level: Needs assistance Equipment used: Rolling walker (2 wheels) Transfers: Sit to/from Stand, Bed to chair/wheelchair/BSC Sit to Stand: Min assist Stand pivot transfers: Min assist         General transfer comment: Pt motivated to try and get up, did rise to standing x2 during session with only minimal assist to insure keeping hips forward but with heavy cuing for set up pt did well    Ambulation/Gait Ambulation/Gait assistance: Min guard Gait Distance (Feet): 35 Feet Assistive device: Rolling walker (2 wheels)         General Gait Details: Pt with very short, shuffling steps, but no LOBs, no excessive fatigue and ultimately she did better than she, her daughter, or this PT expected.  O2 remained in the mid 90s on room air, HR stays below 100  Stairs            Wheelchair Mobility    Modified Rankin (Stroke Patients Only)       Balance Overall balance assessment: Needs assistance Sitting-balance support: No upper extremity supported, Feet supported Sitting balance-Leahy Scale: Fair     Standing balance support: Bilateral upper extremity supported Standing balance-Leahy Scale: Fair  Pertinent Vitals/Pain Pain Assessment Pain Assessment: 0-10 Pain Score: 2  Pain Location: b/l LEs, L>R    Home Living Family/patient expects to be discharged to:: Skilled nursing facility Living Arrangements: Children Available Help at Discharge: Family;Available 24 hours/day Type of Home: Apartment          Home Layout: One level Home Equipment: Agricultural consultant (2 wheels) Additional Comments: `    Prior Function Prior Level of Function : Needs assist             Mobility Comments: MOD I, limited mobility with RW use       Hand Dominance        Extremity/Trunk Assessment   Upper Extremity Assessment Upper Extremity Assessment: Generalized weakness    Lower Extremity Assessment Lower Extremity Assessment: Generalized weakness       Communication   Communication: No difficulties  Cognition Arousal/Alertness: Awake/alert Behavior During Therapy: WFL for tasks assessed/performed Overall Cognitive Status: Within Functional Limits for tasks assessed                                          General Comments      Exercises     Assessment/Plan    PT Assessment Patient needs continued PT services  PT Problem List Decreased strength;Decreased range of motion;Decreased activity tolerance;Decreased balance;Decreased mobility;Decreased knowledge of use of DME;Decreased safety awareness;Cardiopulmonary status limiting activity       PT Treatment Interventions DME instruction;Gait training;Functional mobility training;Therapeutic activities;Therapeutic exercise;Balance training;Patient/family education    PT Goals (Current goals can be found in the Care Plan section)  Acute Rehab PT Goals Patient Stated Goal: get stronger PT Goal Formulation: With patient/family Time For Goal Achievement: 05/30/22 Potential to Achieve Goals: Fair    Frequency Min 2X/week     Co-evaluation               AM-PAC PT "6 Clicks" Mobility  Outcome Measure Help needed turning from your back to your side while in a flat bed without using bedrails?: A Little Help needed moving from lying on your back to sitting on the side of a flat bed without using bedrails?: A Little Help needed moving to and from a bed to a chair (including a wheelchair)?: A Little Help  needed standing up from a chair using your arms (e.g., wheelchair or bedside chair)?: A Little Help needed to walk in hospital room?: A Little Help needed climbing 3-5 steps with a railing? : Total 6 Click Score: 16    End of Session   Activity Tolerance: Patient tolerated treatment well;Patient limited by fatigue Patient left: with chair alarm set;with call bell/phone within reach;with family/visitor present Nurse Communication: Mobility status (O2 on room air) PT Visit Diagnosis: Muscle weakness (generalized) (M62.81);Difficulty in walking, not elsewhere classified (R26.2);Unsteadiness on feet (R26.81);History of falling (Z91.81)    Time: 8329-1916 PT Time Calculation (min) (ACUTE ONLY): 28 min   Charges:   PT Evaluation $PT Eval Low Complexity: 1 Low PT Treatments $Gait Training: 8-22 mins        Malachi Pro, DPT 05/17/2022, 4:35 PM

## 2022-05-18 DIAGNOSIS — A0472 Enterocolitis due to Clostridium difficile, not specified as recurrent: Secondary | ICD-10-CM | POA: Diagnosis not present

## 2022-05-18 LAB — BASIC METABOLIC PANEL
Anion gap: 4 — ABNORMAL LOW (ref 5–15)
BUN: 15 mg/dL (ref 8–23)
CO2: 26 mmol/L (ref 22–32)
Calcium: 8 mg/dL — ABNORMAL LOW (ref 8.9–10.3)
Chloride: 109 mmol/L (ref 98–111)
Creatinine, Ser: 0.64 mg/dL (ref 0.44–1.00)
GFR, Estimated: >60 mL/min (ref >60)
Glucose, Bld: 133 mg/dL — ABNORMAL HIGH (ref 70–99)
Potassium: 4.4 mmol/L (ref 3.5–5.1)
Sodium: 139 mmol/L (ref 135–145)

## 2022-05-18 LAB — CBC
HCT: 33 % — ABNORMAL LOW (ref 36.0–46.0)
Hemoglobin: 10.5 g/dL — ABNORMAL LOW (ref 12.0–15.0)
MCH: 28.3 pg (ref 26.0–34.0)
MCHC: 31.8 g/dL (ref 30.0–36.0)
MCV: 88.9 fL (ref 80.0–100.0)
Platelets: 288 10*3/uL (ref 150–400)
RBC: 3.71 MIL/uL — ABNORMAL LOW (ref 3.87–5.11)
RDW: 15.4 % (ref 11.5–15.5)
WBC: 6.9 10*3/uL (ref 4.0–10.5)
nRBC: 0 % (ref 0.0–0.2)

## 2022-05-18 LAB — GLUCOSE, CAPILLARY
Glucose-Capillary: 120 mg/dL — ABNORMAL HIGH (ref 70–99)
Glucose-Capillary: 291 mg/dL — ABNORMAL HIGH (ref 70–99)
Glucose-Capillary: 258 mg/dL — ABNORMAL HIGH (ref 70–99)
Glucose-Capillary: 195 mg/dL — ABNORMAL HIGH (ref 70–99)

## 2022-05-18 LAB — MAGNESIUM: Magnesium: 2 mg/dL (ref 1.7–2.4)

## 2022-05-18 LAB — PROCALCITONIN: Procalcitonin: 1.26 ng/mL

## 2022-05-18 NOTE — Progress Notes (Signed)
Occupational Therapy Treatment Patient Details Name: Lisa Davis MRN: [id number] DOB: 05-18-1936 Today's Date: 05/18/2022   History of present illness 86 year old female with history of depression, anxiety, non-insulin-dependent diabetes mellitus, hypertension, neuropathy, hypothyroid, GERD, who presented wth generalized weakness and inability to ambulate.   OT comments  Lisa Davis was seen for OT treatment on this date. Upon arrival to room pt reclined in bed, drink spilled onto bed and soiled, agreeable to OOB for bed change. Pt requires MIN A bed mobility. MIN A don/doff gown, MAX A don/doff B socks. MIN A + RW sit<>stand and steps bed>chair, cues to locate chair as pt is legally blind. Left with NT in room. Pt making good progress toward goals, will continue to follow POC. Discharge recommendation remains appropriate.     Recommendations for follow up therapy are one component of a multi-disciplinary discharge planning process, led by the attending physician.  Recommendations may be updated based on patient status, additional functional criteria and insurance authorization.    Follow Up Recommendations  Skilled nursing-short term rehab (<3 hours/day)    Assistance Recommended at Discharge Frequent or constant Supervision/Assistance  Patient can return home with the following  Two people to help with walking and/or transfers;A lot of help with bathing/dressing/bathroom   Equipment Recommendations  Other (comment)    Recommendations for Other Services      Precautions / Restrictions Precautions Precautions: Fall Restrictions Weight Bearing Restrictions: No       Mobility Bed Mobility Overal bed mobility: Needs Assistance Bed Mobility: Supine to Sit     Supine to sit: Min assist          Transfers Overall transfer level: Needs assistance Equipment used: Rolling walker (2 wheels) Transfers: Sit to/from Stand, Bed to chair/wheelchair/BSC Sit to Stand: Min  assist Stand pivot transfers: Min assist               Balance Overall balance assessment: Needs assistance Sitting-balance support: No upper extremity supported, Feet supported Sitting balance-Leahy Scale: Fair     Standing balance support: Bilateral upper extremity supported Standing balance-Leahy Scale: Fair                             ADL either performed or assessed with clinical judgement   ADL Overall ADL's : Needs assistance/impaired                                       General ADL Comments: MIN A don /doff gown in sitting. MAX A don/doff B socks. MIN A + RW fro simulated BSC t/f      Cognition Arousal/Alertness: Awake/alert Behavior During Therapy: WFL for tasks assessed/performed Overall Cognitive Status: Within Functional Limits for tasks assessed                                                     Pertinent Vitals/ Pain       Pain Assessment Pain Assessment: No/denies pain   Frequency  Min 2X/week        Progress Toward Goals  OT Goals(current goals can now be found in the care plan section)  Progress towards OT goals: Progressing toward goals  Acute Rehab OT Goals Patient Stated Goal:  to go home OT Goal Formulation: With patient/family Time For Goal Achievement: 05/31/22 Potential to Achieve Goals: Good ADL Goals Pt Will Perform Grooming: with modified independence;sitting Pt Will Perform Lower Body Dressing: with min assist;sit to/from stand Pt Will Transfer to Toilet: with min assist;ambulating;bedside commode  Plan Discharge plan remains appropriate;Frequency remains appropriate    Co-evaluation                 AM-PAC OT "6 Clicks" Daily Activity     Outcome Measure   Help from another person eating meals?: A Little Help from another person taking care of personal grooming?: A Little Help from another person toileting, which includes using toliet, bedpan, or urinal?: A  Lot Help from another person bathing (including washing, rinsing, drying)?: A Lot Help from another person to put on and taking off regular upper body clothing?: A Little Help from another person to put on and taking off regular lower body clothing?: A Lot 6 Click Score: 15    End of Session    OT Visit Diagnosis: Muscle weakness (generalized) (M62.81)   Activity Tolerance Patient tolerated treatment well   Patient Left in chair;with call bell/phone within reach;with nursing/sitter in room   Nurse Communication Mobility status        Time: 1536-1550 OT Time Calculation (min): 14 min  Charges: OT General Charges $OT Visit: 1 Visit OT Treatments $Self Care/Home Management : 8-22 mins  Kathie Dike, M.S. OTR/L  05/18/22, 4:03 PM  ascom [phone]

## 2022-05-18 NOTE — Progress Notes (Signed)
Patient did not void spontaneously overnight. Bladder scanned twice with max volume of 363. Orders to in and out cath if >400. Encouraged patient to continue to try to void with fluids and positioning. Pt denies having the urge to do so.

## 2022-05-18 NOTE — Progress Notes (Signed)
PROGRESS NOTE    Lisa Davis  OBS:[id number] DOB: Oct 02, 1936 DOA: 05/16/2022 PCP: Vernona Rieger, MD  108A/108A-AA  LOS: 1 day   Brief hospital course: [name] is  a 86 year old female with history of depression, anxiety, non-insulin-dependent diabetes mellitus, hypertension, neuropathy, hypothyroid, GERD, who presents emergency department for chief concerns of generalized weakness and inability to ambulate on her own.  Patient was recently diagnosed with C. difficile colitis on 05/07/2022 and has been prescribed vancomycin p.o. 4 times daily, for 10 days completion on 05/18/2022.  Initial vitals in the emergency department showed temperature of 97.9, respiration rate of 18, heart rate of 80, blood pressure 141/65, SpO2 100% on room air.  Serum sodium is 140, potassium 3.8, chloride 106, bicarb 25, BUN of 11, serum creatinine of 0.76, GFR greater than 60, WBC 9.9, hemoglobin 11.3, platelets of 312.  High-sensitivity troponin was 10.  Magnesium was 1.1.  ED treatment: Magnesium 2 g IV one-time dose.  The patient still having diarrhea near the end of her vancomycin course, case discussed with ID pharmacist and will switch over to Dificid.  He sent the prescription over to her pharmacy and confirmed cost of only $38.  Replacing magnesium and potassium today.  Patient also with urinary retention and starting in and out catheterizations.  Blood pressure on the lower side this morning giving fluid bolus and IV fluids and holding antihypertensive medications.  Assessment & Plan:  * C. difficile colitis Patient still having diarrhea being near the end of her p.o. vancomycin course.  Case discussed with ID pharmacist and will switch over to Dificid.  Cost is only to be $30 and this was sent into her pharmacy. --cont Dificid  Hypotension Improved with IVF. Plan: --d/c MIVF, encourage oral hydration Hold antihypertensive medications.     Hypomagnesemia Monitor and  replete PRN  Hypokalemia Monitor and replete PRN  Acute respiratory failure with hypoxia (HCC) Pulse ox of 86%.  Not on Home O2.   Started budesonide and DuoNeb nebulizer treatments. --Continue supplemental O2 to keep sats >=90%, wean as tolerated  Weakness PT/OT --SNF rehab  Major depressive disorder Depression and anxiety.  On Tranxene and Zoloft.  Acute urinary retention monitor  Hypophosphatemia Monitor and replete PRN  Legally blind Retinitis pigmentosis Gradually worsened since 1984  GERD (gastroesophageal reflux disease) - cont PPI   Hypothyroidism - cont Levothyroxine 75 mcg daily   Uncontrolled type 2 diabetes mellitus with hyperglycemia, without long-term current use of insulin (HCC) Type 2 diabetes mellitus with hyperglycemia. --Last hemoglobin A1c elevated 8.5. Plan: Holding metformin especially with diarrhea.   Cont glargine 6u nightly ACHS and SSI    DVT prophylaxis: Lovenox SQ Code Status: Full code  Family Communication: daughter updated at bedside today Level of care: Med-Surg Dispo:   The patient is from: home Anticipated d/c is to: SNF Anticipated d/c date is: 1-2 days   Subjective and Interval History:  Pt reported less diarrhea.  No abdominal pain.  Appetite returned this morning.     Objective: Vitals:   05/18/22 0514 05/18/22 0750 05/18/22 0812 05/18/22 1551  BP: 109/72 139/64  138/74  Pulse: 91 (!) 103  97  Resp:  15  17  Temp: (!) 97.5 F (36.4 C) 98.2 F (36.8 C)  98.1 F (36.7 C)  TempSrc:      SpO2: 97% 99% 94% 98%  Weight:      Height:        Intake/Output Summary (Last 24 hours) at 05/18/2022 1636 Last  data filed at 05/18/2022 1552 Gross per 24 hour  Intake 658.94 ml  Output 875 ml  Net -216.06 ml   Filed Weights   05/16/22 1819  Weight: 64.7 kg    Examination:   Constitutional: NAD, AAOx3 HEENT: conjunctivae and lids normal, EOMI CV: No cyanosis.   RESP: normal respiratory effort, on  2L Extremities: non-pitting mild edema in BLE SKIN: warm, dry Neuro: II - XII grossly intact.   Psych: Normal mood and affect.  Appropriate judgement and reason   Data Reviewed: I have personally reviewed labs and imaging studies  Time spent: 35 minutes  Darlin Priestly, MD [facility] If 7PM-7AM, please contact night-coverage 05/18/2022, 4:36 PM

## 2022-05-19 DIAGNOSIS — A0472 Enterocolitis due to Clostridium difficile, not specified as recurrent: Secondary | ICD-10-CM | POA: Diagnosis not present

## 2022-05-19 LAB — CBC
HCT: 33.4 % — ABNORMAL LOW (ref 36.0–46.0)
Hemoglobin: 10.8 g/dL — ABNORMAL LOW (ref 12.0–15.0)
MCH: 28.9 pg (ref 26.0–34.0)
MCHC: 32.3 g/dL (ref 30.0–36.0)
MCV: 89.3 fL (ref 80.0–100.0)
Platelets: 309 10*3/uL (ref 150–400)
RBC: 3.74 MIL/uL — ABNORMAL LOW (ref 3.87–5.11)
RDW: 15.4 % (ref 11.5–15.5)
WBC: 8.1 10*3/uL (ref 4.0–10.5)
nRBC: 0 % (ref 0.0–0.2)

## 2022-05-19 LAB — BASIC METABOLIC PANEL
Anion gap: 3 — ABNORMAL LOW (ref 5–15)
BUN: 12 mg/dL (ref 8–23)
CO2: 26 mmol/L (ref 22–32)
Calcium: 8.5 mg/dL — ABNORMAL LOW (ref 8.9–10.3)
Chloride: 107 mmol/L (ref 98–111)
Creatinine, Ser: 0.62 mg/dL (ref 0.44–1.00)
GFR, Estimated: >60 mL/min (ref >60)
Glucose, Bld: 232 mg/dL — ABNORMAL HIGH (ref 70–99)
Potassium: 5.1 mmol/L (ref 3.5–5.1)
Sodium: 136 mmol/L (ref 135–145)

## 2022-05-19 LAB — GLUCOSE, CAPILLARY
Glucose-Capillary: 182 mg/dL — ABNORMAL HIGH (ref 70–99)
Glucose-Capillary: 207 mg/dL — ABNORMAL HIGH (ref 70–99)
Glucose-Capillary: 281 mg/dL — ABNORMAL HIGH (ref 70–99)
Glucose-Capillary: 212 mg/dL — ABNORMAL HIGH (ref 70–99)

## 2022-05-19 LAB — PROCALCITONIN: Procalcitonin: 0.52 ng/mL

## 2022-05-19 LAB — MAGNESIUM: Magnesium: 1.6 mg/dL — ABNORMAL LOW (ref 1.7–2.4)

## 2022-05-19 MED ORDER — MAGNESIUM SULFATE 2 GM/50ML IV SOLN
2.0000 g | Freq: Once | INTRAVENOUS | Status: AC
  Administered 2022-05-19: 2 g via INTRAVENOUS
  Filled 2022-05-19: qty 50

## 2022-05-19 MED ORDER — INSULIN ASPART 100 UNIT/ML IJ SOLN
5.0000 [IU] | Freq: Three times a day (TID) | INTRAMUSCULAR | Status: DC
  Administered 2022-05-19 – 2022-05-20 (×2): 5 [IU] via SUBCUTANEOUS
  Filled 2022-05-19 (×2): qty 1

## 2022-05-19 MED ORDER — TRAZODONE HCL 50 MG PO TABS
50.0000 mg | ORAL_TABLET | Freq: Every evening | ORAL | Status: DC | PRN
  Administered 2022-05-19: 50 mg via ORAL
  Filled 2022-05-19: qty 1

## 2022-05-19 NOTE — NC FL2 (Signed)
[place] MEDICAID FL2 LEVEL OF CARE SCREENING TOOL     IDENTIFICATION  Patient Name: Lisa Davis Birthdate: 1935-12-26 Sex: female Admission Date (Current Location): 05/16/2022  [facility] and [place] Number:      Facility and Address:  [facility], [address], [place], [place] [address]      Provider Number:    Attending Physician Name and Address:  Darlin Priestly, MD  Relative Name and Phone Number:  Skeet Simmer [phone]    Current Level of Care: Hospital Recommended Level of Care: Skilled Nursing Facility Prior Approval Number:    Date Approved/Denied:   PASRR Number: pending  Discharge Plan: SNF    Current Diagnoses: Patient Active Problem List   Diagnosis Date Noted   Hypokalemia 05/17/2022   Acute respiratory failure with hypoxia (HCC) 05/17/2022   Acute urinary retention 05/17/2022   Hypotension 05/17/2022   Weakness 05/16/2022   Uncontrolled type 2 diabetes mellitus with hyperglycemia, without long-term current use of insulin (HCC) 05/16/2022   Hypothyroidism 05/16/2022   GERD (gastroesophageal reflux disease) 05/16/2022   Generalized anxiety disorder 05/16/2022   Major depressive disorder 05/16/2022   Dyslipidemia 05/16/2022   C. difficile colitis 05/16/2022   Hypomagnesemia 05/16/2022   Legally blind 05/16/2022   Hypophosphatemia 05/16/2022    Orientation RESPIRATION BLADDER Height & Weight     Self, Place  Normal Incontinent Weight: 142 lb 10.2 oz (64.7 kg) Height:  5\' 3"  (160 cm)  BEHAVIORAL SYMPTOMS/MOOD NEUROLOGICAL BOWEL NUTRITION STATUS      Continent Diet  AMBULATORY STATUS COMMUNICATION OF NEEDS Skin   Extensive Assist Verbally Normal                       Personal Care Assistance Level of Assistance  Bathing, Feeding, Dressing Bathing Assistance: Maximum assistance Feeding assistance: Independent Dressing Assistance: Maximum assistance     Functional Limitations Info  Sight, Hearing,  Speech Sight Info: Impaired Hearing Info: Adequate Speech Info: Adequate    SPECIAL CARE FACTORS FREQUENCY  PT (By licensed PT), OT (By licensed OT)     PT Frequency: 5x per week OT Frequency: 5x per week            Contractures Contractures Info: Not present    Additional Factors Info  Code Status, Allergies Code Status Info: full code Allergies Info: chlorhexidine, meperidine, statins, codeine, morphine, pedi-pe tape spray, proridone iodine           Current Medications (05/19/2022):  This is the current hospital active medication list Current Facility-Administered Medications  Medication Dose Route Frequency Provider Last Rate Last Admin   (feeding supplement) PROSource Plus liquid 30 mL  30 mL Oral TID BM Wieting, Richard, MD   30 mL at 05/18/22 1816   acetaminophen (TYLENOL) tablet 650 mg  650 mg Oral Q6H PRN Cox, Amy N, DO   650 mg at 05/19/22 0732   Or   acetaminophen (TYLENOL) suppository 650 mg  650 mg Rectal Q6H PRN Cox, Amy N, DO   650 mg at 05/17/22 0540   perphenazine (TRILAFON) tablet 2 mg  2 mg Oral QHS Nazari, Walid A, RPH   2 mg at 05/18/22 2159   And   amitriptyline (ELAVIL) tablet 25 mg  25 mg Oral QHS 2160 A, RPH   25 mg at 05/18/22 2159   budesonide (PULMICORT) nebulizer solution 0.25 mg  0.25 mg Nebulization BID 2160, MD   0.25 mg at 05/19/22 0743   clorazepate (TRANXENE)  tablet 3.75 mg  3.75 mg Oral BID PRN Cox, Amy N, DO   3.75 mg at 05/19/22 1012   [START ON 05/20/2022] cyanocobalamin (VITAMIN B12) injection 1,000 mcg  1,000 mcg Intramuscular Weekly Cox, Amy N, DO       enoxaparin (LOVENOX) injection 40 mg  40 mg Subcutaneous Q24H Jaynie Bream, RPH   40 mg at 05/18/22 2159   feeding supplement (BOOST / RESOURCE BREEZE) liquid 1 Container  1 Container Oral TID BM Alford Highland, MD   1 Container at 05/18/22 1440   fidaxomicin (DIFICID) tablet 200 mg  200 mg Oral BID Alford Highland, MD   200 mg at 05/19/22 0943    fluticasone (FLONASE) 50 MCG/ACT nasal spray 1 spray  1 spray Each Nare Daily PRN Cox, Amy N, DO       insulin aspart (novoLOG) injection 0-15 Units  0-15 Units Subcutaneous TID WC Cox, Amy N, DO   3 Units at 05/19/22 3235   insulin aspart (novoLOG) injection 0-5 Units  0-5 Units Subcutaneous QHS Cox, Amy N, DO       insulin glargine-yfgn (SEMGLEE) injection 6 Units  6 Units Subcutaneous QHS Alford Highland, MD   6 Units at 05/18/22 2159   ipratropium-albuterol (DUONEB) 0.5-2.5 (3) MG/3ML nebulizer solution 3 mL  3 mL Nebulization BID Alford Highland, MD   3 mL at 05/19/22 0743   levothyroxine (SYNTHROID) tablet 75 mcg  75 mcg Oral Q0600 Cox, Amy N, DO   75 mcg at 05/19/22 0502   meclizine (ANTIVERT) tablet 25 mg  25 mg Oral BID Cox, Amy N, DO   25 mg at 05/19/22 5732   multivitamin with minerals tablet 1 tablet  1 tablet Oral Daily Alford Highland, MD   1 tablet at 05/19/22 0943   ondansetron (ZOFRAN) tablet 4 mg  4 mg Oral Q6H PRN Cox, Amy N, DO       Or   ondansetron (ZOFRAN) injection 4 mg  4 mg Intravenous Q6H PRN Cox, Amy N, DO       pantoprazole (PROTONIX) EC tablet 40 mg  40 mg Oral Daily Cox, Amy N, DO   40 mg at 05/19/22 0944   pneumococcal 20-valent conjugate vaccine (PREVNAR 20) injection 0.5 mL  0.5 mL Intramuscular Tomorrow-1000 Cox, Amy N, DO       sertraline (ZOLOFT) tablet 150 mg  150 mg Oral Daily Cox, Amy N, DO   150 mg at 05/19/22 0944   traMADol (ULTRAM) tablet 50 mg  50 mg Oral Q12H PRN Cox, Amy N, DO   50 mg at 05/19/22 0943   traZODone (DESYREL) tablet 50 mg  50 mg Oral QHS PRN Mansy, Jan A, MD   50 mg at 05/19/22 0126   zolpidem (AMBIEN) tablet 5 mg  5 mg Oral QHS PRN Cox, Amy N, DO   5 mg at 05/18/22 2159     Discharge Medications: Please see discharge summary for a list of discharge medications.  Relevant Imaging Results:  Relevant Lab Results:   Additional Information SS#[id number]  Verna Czech [place], [place]
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
## 2022-05-19 NOTE — Progress Notes (Signed)
PROGRESS NOTE    Lisa Davis  HEN:[id number] DOB: Aug 19, 1936 DOA: 05/16/2022 PCP: Vernona Rieger, MD  108A/108A-AA  LOS: 2 days   Brief hospital course: Lisa Davis is  a 86 year old female with history of depression, anxiety, non-insulin-dependent diabetes mellitus, hypertension, neuropathy, hypothyroid, GERD, who presents emergency department for chief concerns of generalized weakness and inability to ambulate on her own.  Patient was recently diagnosed with C. difficile colitis on 05/07/2022 and has been prescribed vancomycin p.o. 4 times daily, for 10 days completion on 05/18/2022.  Initial vitals in the emergency department showed temperature of 97.9, respiration rate of 18, heart rate of 80, blood pressure 141/65, SpO2 100% on room air.  Serum sodium is 140, potassium 3.8, chloride 106, bicarb 25, BUN of 11, serum creatinine of 0.76, GFR greater than 60, WBC 9.9, hemoglobin 11.3, platelets of 312.  High-sensitivity troponin was 10.  Magnesium was 1.1.  ED treatment: Magnesium 2 g IV one-time dose.  The patient still having diarrhea near the end of her vancomycin course, case discussed with ID pharmacist and will switch over to Dificid.  He sent the prescription over to her pharmacy and confirmed cost of only $38.  Replacing magnesium and potassium today.  Patient also with urinary retention and starting in and out catheterizations.  Blood pressure on the lower side this morning giving fluid bolus and IV fluids and holding antihypertensive medications.  Assessment & Plan:  * C. difficile colitis Patient still having diarrhea being near the end of her p.o. vancomycin course.  Case discussed with ID pharmacist and will switch over to Dificid.  Cost is only to be $30 and this was sent into her pharmacy. --cont Dificid  Hypotension Improved with IVF. Plan: --encourage oral hydration Hold antihypertensive medications for now   Hypomagnesemia Monitor and replete  PRN  Hypokalemia Monitor and replete PRN  Acute respiratory failure with hypoxia (HCC) Pulse ox of 86%.  Unclear etiology.  Not on Home O2.   Started budesonide and DuoNeb nebulizer treatments. --Continue supplemental O2 to keep sats >=90%, wean as tolerated  Weakness PT/OT --SNF rehab  Major depressive disorder Depression and anxiety.  On Tranxene and Zoloft.  Acute urinary retention monitor  Hypophosphatemia Monitor and replete PRN  Legally blind Retinitis pigmentosis Gradually worsened since 1984  GERD (gastroesophageal reflux disease) - cont PPI   Hypothyroidism - cont Levothyroxine 75 mcg daily   Uncontrolled type 2 diabetes mellitus with hyperglycemia, without long-term current use of insulin (HCC) Type 2 diabetes mellitus with hyperglycemia. --Last hemoglobin A1c elevated 8.5.  Take metformin and glipizide at home. Plan: --hold home oral agents   Cont glargine 6u nightly ACHS and SSI --add mealtime 5u TID    DVT prophylaxis: Lovenox SQ Code Status: Full code  Family Communication: daughter updated at bedside today Level of care: Med-Surg Dispo:   The patient is from: home Anticipated d/c is to: SNF Anticipated d/c date is: whenever bed available   Subjective and Interval History:  Overnight, pt was severely agitated and hallucinating.  This morning, pt was back to normal, calm and cooperative.  Having soft stool.  Pt complained of pain on the outside when she voids.   Objective: Vitals:   05/18/22 2108 05/19/22 0742 05/19/22 0747 05/19/22 1615  BP: (!) 141/57 (!) 156/85  (!) 118/50  Pulse: 95 96  (!) 104  Resp: 20 20  18   Temp: 98 F (36.7 C) 99.2 F (37.3 C)  98.3 F (36.8 C)  TempSrc: Oral  SpO2: 100% 97% 95% 99%  Weight:      Height:        Intake/Output Summary (Last 24 hours) at 05/19/2022 1647 Last data filed at 05/19/2022 1630 Gross per 24 hour  Intake 120 ml  Output 1600 ml  Net -1480 ml   Filed Weights   05/16/22 1819   Weight: 64.7 kg    Examination:   Constitutional: NAD, alert, oriented to person and place HEENT: conjunctivae and lids normal, EOMI CV: No cyanosis.   RESP: normal respiratory effort, on RA Neuro: II - XII grossly intact.     Data Reviewed: I have personally reviewed labs and imaging studies  Time spent: 35 minutes  Darlin Priestly, MD [facility] If 7PM-7AM, please contact night-coverage 05/19/2022, 4:47 PM

## 2022-05-19 NOTE — TOC Initial Note (Signed)
Transition of Care [facility]) - Initial/Assessment Note    Patient Details  Name: Lisa Davis MRN: [id number] Date of Birth: 04-17-1936  [facility]) CM/SW Contact:    Lisa Davis, [place] Phone Number: 05/19/2022, 10:45 AM  Clinical Narrative:                 This Education officer, museum met with patient and daughter Lisa Davis at bedside to discuss recommendation for SNF. Patient oriented X2. Per patient's daughter, patient is from [place], [place] currently resides with daughter Lisa Davis, who takes care of her care needs. Patient has been here since March. Patient has a walker and wheelchair in the home and is followed by Dr. Carlis Davis through [facility]. Per patient's daughter, they are agreeable with plan for SNF. Per patient's daughter, they prefer [facility] as patient as received care there in the past.  The family has contacted the admissions coordinator. PASRR currently pending, Fl2 sent to [facility] for review.  [place], LCSW Transition of Care [phone]   Expected Discharge Plan: [place] Barriers to Discharge: Continued Medical Work up   Patient Goals and CMS Choice Patient states their goals for this hospitalization and ongoing recovery are:: to return to [facility] for rehab      Expected Discharge Plan and Services Expected Discharge Plan: [place] In-house Referral: Clinical Social Work     Living arrangements for the past 2 months: Apartment                                      Prior Living Arrangements/Services Living arrangements for the past 2 months: Apartment Lives with:: Adult Children Patient language and need for interpreter reviewed:: Yes Do you feel safe going back to the place where you live?: Yes      Need for Family Participation in Patient Care: Yes (Comment) Care giver support system in place?: Yes (comment)   Criminal Activity/Legal Involvement Pertinent to Current  Situation/Hospitalization: No - Comment as needed  Activities of Daily Living Home Assistive Devices/Equipment: Environmental consultant (specify type) ADL Screening (condition at time of admission) Patient's cognitive ability adequate to safely complete daily activities?: Yes Is the patient deaf or have difficulty hearing?: Yes Does the patient have difficulty seeing, even when wearing glasses/contacts?: Yes Does the patient have difficulty concentrating, remembering, or making decisions?: No Patient able to express need for assistance with ADLs?: Yes Does the patient have difficulty dressing or bathing?: Yes Independently performs ADLs?: No Communication: Needs assistance Is this a change from baseline?: Pre-admission baseline Dressing (OT): Needs assistance Is this a change from baseline?: Pre-admission baseline Grooming: Needs assistance Is this a change from baseline?: Pre-admission baseline Feeding: Needs assistance Is this a change from baseline?: Pre-admission baseline Bathing: Needs assistance Is this a change from baseline?: Pre-admission baseline Toileting: Needs assistance Is this a change from baseline?: Pre-admission baseline Walks in Home: Needs assistance Does the patient have difficulty walking or climbing stairs?: Yes Weakness of Legs: Both Weakness of Arms/Hands: Both  Permission Sought/Granted            Permission granted to share info w Relationship: daughter  Permission granted to share info w Contact Information: Lisa Davis [phone]  Emotional Assessment Appearance:: Appears stated age   Affect (typically observed): Accepting, Adaptable Orientation: : Oriented to Place, Oriented to Self Alcohol / Substance Use: Not Applicable Psych Involvement: No (comment)  Admission diagnosis:  Hypomagnesemia [E83.42] Weakness [R53.1] Clostridioides difficile diarrhea [A04.72] Hypokalemia [E87.6] Patient Active Problem List   Diagnosis Date Noted   Hypokalemia  05/17/2022   Acute respiratory failure with hypoxia ([place]) 05/17/2022   Acute urinary retention 05/17/2022   Hypotension 05/17/2022   Weakness 05/16/2022   Uncontrolled type 2 diabetes mellitus with hyperglycemia, without long-term current use of insulin ([place]) 05/16/2022   Hypothyroidism 05/16/2022   GERD (gastroesophageal reflux disease) 05/16/2022   Generalized anxiety disorder 05/16/2022   Major depressive disorder 05/16/2022   Dyslipidemia 05/16/2022   C. difficile colitis 05/16/2022   Hypomagnesemia 05/16/2022   Legally blind 05/16/2022   Hypophosphatemia 05/16/2022   PCP:  Lisa Friendly, MD Pharmacy:   [id number] Lisa [place], [place] [address] Phone: [phone] Fax: [phone]     Social Determinants of Health (SDOH) Interventions    Readmission Risk Interventions     No data to display

## 2022-05-20 DIAGNOSIS — A0472 Enterocolitis due to Clostridium difficile, not specified as recurrent: Secondary | ICD-10-CM | POA: Diagnosis not present

## 2022-05-20 LAB — CBC
HCT: 34 % — ABNORMAL LOW (ref 36.0–46.0)
Hemoglobin: 10.9 g/dL — ABNORMAL LOW (ref 12.0–15.0)
MCH: 28.5 pg (ref 26.0–34.0)
MCHC: 32.1 g/dL (ref 30.0–36.0)
MCV: 89 fL (ref 80.0–100.0)
Platelets: 312 10*3/uL (ref 150–400)
RBC: 3.82 MIL/uL — ABNORMAL LOW (ref 3.87–5.11)
RDW: 15.4 % (ref 11.5–15.5)
WBC: 8.5 10*3/uL (ref 4.0–10.5)
nRBC: 0 % (ref 0.0–0.2)

## 2022-05-20 LAB — BASIC METABOLIC PANEL
Anion gap: 6 (ref 5–15)
BUN: 10 mg/dL (ref 8–23)
CO2: 28 mmol/L (ref 22–32)
Calcium: 9 mg/dL (ref 8.9–10.3)
Chloride: 106 mmol/L (ref 98–111)
Creatinine, Ser: 0.64 mg/dL (ref 0.44–1.00)
GFR, Estimated: >60 mL/min (ref >60)
Glucose, Bld: 177 mg/dL — ABNORMAL HIGH (ref 70–99)
Potassium: 5.3 mmol/L — ABNORMAL HIGH (ref 3.5–5.1)
Sodium: 140 mmol/L (ref 135–145)

## 2022-05-20 LAB — GLUCOSE, CAPILLARY
Glucose-Capillary: 197 mg/dL — ABNORMAL HIGH (ref 70–99)
Glucose-Capillary: 286 mg/dL — ABNORMAL HIGH (ref 70–99)
Glucose-Capillary: 279 mg/dL — ABNORMAL HIGH (ref 70–99)
Glucose-Capillary: 222 mg/dL — ABNORMAL HIGH (ref 70–99)

## 2022-05-20 LAB — MAGNESIUM: Magnesium: 1.8 mg/dL (ref 1.7–2.4)

## 2022-05-20 MED ORDER — LEVALBUTEROL HCL 0.63 MG/3ML IN NEBU
0.6300 mg | INHALATION_SOLUTION | Freq: Two times a day (BID) | RESPIRATORY_TRACT | Status: DC
Start: 2022-05-21 — End: 2022-05-21
  Administered 2022-05-21: 0.63 mg via RESPIRATORY_TRACT
  Filled 2022-05-20: qty 3

## 2022-05-20 MED ORDER — INSULIN GLARGINE-YFGN 100 UNIT/ML ~~LOC~~ SOLN
8.0000 [IU] | Freq: Every day | SUBCUTANEOUS | Status: DC
Start: 2022-05-20 — End: 2022-05-20

## 2022-05-20 MED ORDER — GLIPIZIDE 10 MG PO TABS
10.0000 mg | ORAL_TABLET | Freq: Two times a day (BID) | ORAL | Status: DC
  Filled 2022-05-20: qty 1

## 2022-05-20 MED ORDER — VITAMIN D (ERGOCALCIFEROL) 1.25 MG (50000 UNIT) PO CAPS
50000.0000 [IU] | ORAL_CAPSULE | ORAL | Status: DC
  Administered 2022-05-20: 50000 [IU] via ORAL
  Filled 2022-05-20: qty 1

## 2022-05-20 MED ORDER — PANTOPRAZOLE SODIUM 40 MG PO TBEC
40.0000 mg | DELAYED_RELEASE_TABLET | Freq: Every day | ORAL | Status: DC
  Administered 2022-05-21: 40 mg via ORAL
  Filled 2022-05-20: qty 1

## 2022-05-20 MED ORDER — PANTOPRAZOLE SODIUM 20 MG PO TBEC: 20.0000 mg | DELAYED_RELEASE_TABLET | Freq: Every day | ORAL | Status: DC

## 2022-05-20 MED ORDER — METFORMIN HCL 500 MG PO TABS
1000.0000 mg | ORAL_TABLET | Freq: Two times a day (BID) | ORAL | Status: DC
  Administered 2022-05-20 – 2022-05-21 (×2): 1000 mg via ORAL
  Filled 2022-05-20 (×2): qty 2

## 2022-05-20 MED ORDER — POLYSACCHARIDE IRON COMPLEX 150 MG PO CAPS
150.0000 mg | ORAL_CAPSULE | Freq: Every day | ORAL | Status: DC
  Administered 2022-05-21: 150 mg via ORAL
  Filled 2022-05-20 (×2): qty 1

## 2022-05-20 MED ORDER — PERPHENAZINE 4 MG PO TABS
4.0000 mg | ORAL_TABLET | Freq: Every day | ORAL | Status: DC
  Administered 2022-05-20: 4 mg via ORAL
  Filled 2022-05-20: qty 1

## 2022-05-20 MED ORDER — LATANOPROST 0.005 % OP SOLN
1.0000 [drp] | Freq: Every day | OPHTHALMIC | Status: DC
  Administered 2022-05-20: 1 [drp] via OPHTHALMIC
  Filled 2022-05-20: qty 2.5

## 2022-05-20 MED ORDER — IPRATROPIUM BROMIDE 0.02 % IN SOLN
0.5000 mg | Freq: Two times a day (BID) | RESPIRATORY_TRACT | Status: DC
Start: 2022-05-21 — End: 2022-05-21
  Administered 2022-05-21: 0.5 mg via RESPIRATORY_TRACT
  Filled 2022-05-20: qty 2.5

## 2022-05-20 MED ORDER — GLIPIZIDE 5 MG PO TABS
5.0000 mg | ORAL_TABLET | Freq: Two times a day (BID) | ORAL | Status: DC
  Filled 2022-05-20: qty 1

## 2022-05-20 MED ORDER — AMITRIPTYLINE HCL 25 MG PO TABS
50.0000 mg | ORAL_TABLET | Freq: Every day | ORAL | Status: DC
  Administered 2022-05-20: 50 mg via ORAL
  Filled 2022-05-20: qty 2

## 2022-05-20 MED ORDER — GLIPIZIDE ER 10 MG PO TB24
10.0000 mg | ORAL_TABLET | Freq: Every day | ORAL | Status: DC
  Administered 2022-05-21: 10 mg via ORAL
  Filled 2022-05-20: qty 1

## 2022-05-20 MED ORDER — METFORMIN HCL 500 MG PO TABS: 500.0000 mg | ORAL_TABLET | Freq: Two times a day (BID) | ORAL | Status: DC

## 2022-05-20 NOTE — Progress Notes (Signed)
       CROSS COVER NOTE  NAME: Nolia Tschantz MRN: [id number] DOB : Apr 25, 1936    Date of Service   05/20/2022  HPI/Events of Note   Contacted by respiratory therapy who reports M(r)s Bittick is feeling anxious/jittery after Duoneb nebulizer tonight.  Interventions   Plan: Duoneb changed to Atrovent + Xopenex BID     This document was prepared using Dragon voice recognition software and may include unintentional dictation errors.  Bishop Limbo DNP, MHA, FNP-BC Nurse Practitioner [facility] Pager [phone]

## 2022-05-20 NOTE — Progress Notes (Signed)
   05/20/22 1618  Assess: MEWS Score  Temp 98.1 F (36.7 C)  BP (!) 142/76  MAP (mmHg) 93  Pulse Rate (!) 113  Resp 16  SpO2 94 %  O2 Device Room Air  Assess: MEWS Score  MEWS Temp 0  MEWS Systolic 0  MEWS Pulse 2  MEWS RR 0  MEWS LOC 0  MEWS Score 2  MEWS Score Color Yellow  Assess: if the MEWS score is Yellow or Red  Were vital signs taken at a resting state? Yes  Focused Assessment No change from prior assessment  Does the patient meet 2 or more of the SIRS criteria? Yes  Does the patient have a confirmed or suspected source of infection? Yes  Provider and Rapid Response Notified? Yes (Secure Chat Dr. Allena Katz, [place])  MEWS guidelines implemented *See Row Information* Yes  Treat  Pain Scale 0-10  Pain Score 0  Take Vital Signs  Increase Vital Sign Frequency  Yellow: Q 2hr X 2 then Q 4hr X 2, if remains yellow, continue Q 4hrs  Escalate  MEWS: Escalate Yellow: discuss with charge nurse/RN and consider discussing with provider and RRT  Notify: Charge Nurse/RN  Name of Charge Nurse/RN Notified Daria Pastures, RN  Date Charge Nurse/RN Notified 05/20/22  Time Charge Nurse/RN Notified 1619  Notify: Provider  Provider Name/Title Enedina Finner, MD  Date Provider Notified 05/20/22  Method of Notification  (Secure Chat)  Notification Reason Other (Comment) (Patients Pulse rate was 113)  Provider response Other (Comment) (Continue to monitor)  Date of Provider Response 05/20/22  Time of Provider Response 1627  Document  Patient Outcome Other (Comment) (Continue to monitor)  Assess: SIRS CRITERIA  SIRS Temperature  0  SIRS Pulse 1  SIRS Respirations  0  SIRS WBC 1  SIRS Score Sum  2

## 2022-05-20 NOTE — Progress Notes (Signed)
  Progress Note   Patient: Lisa Davis HFW:[id number] DOB: [birth year] DOA: 05/16/2022     3 DOS: the patient was seen and examined on 05/20/2022   Brief hospital course: Lisa Davis is  a 86 year old female with history of depression, anxiety, non-insulin-dependent diabetes mellitus, hypertension, neuropathy, hypothyroid, GERD, who presents emergency department for chief concerns of generalized weakness and inability to ambulate on her own.  Patient was recently diagnosed with C. difficile colitis on 05/07/2022 and has been prescribed vancomycin p.o. 4 times daily, for 10 days completion on 05/18/2022.  ED treatment: Magnesium 2 g IV one-time dose.  The patient still having diarrhea near the end of her vancomycin course, case discussed with ID pharmacist and will switch over to [facility].  He sent the prescription over to her pharmacy and confirmed cost of only $38.  Replacing magnesium and potassium as needed.    Assessment and Plan: * C. difficile colitis Patient still having diarrhea being near the end of her p.o. vancomycin course.  Case discussed with ID pharmacist and will switch over to [facility].  Cost is only to be $30 and this was sent into her pharmacy. --cont Dificid for 10 days --stools more mushy   Hypotension Improved with IVF. Plan: --encourage oral hydration Hold antihypertensive medications for now   Hypomagnesemia Monitor and replete PRN  Hypokalemia Monitor and replete PRN  Acute respiratory failure with hypoxia (HCC)-resolved as of 05/20/2022 Pulse ox of 86%.  Unclear etiology.  Not on Home O2.   Started budesonide and DuoNeb nebulizer treatments. --Continue supplemental O2 to keep sats >=90%, wean as tolerated --resolved  Weakness PT/OT --SNF rehab  Major depressive disorder Depression and anxiety.  On Tranxene and Zoloft.  Acute urinary retention monitor  Hypophosphatemia Monitor and replete PRN  Legally blind Retinitis  pigmentosis Gradually worsened since 1984  GERD (gastroesophageal reflux disease) - cont PPI   Hypothyroidism - cont Levothyroxine 75 mcg daily   Uncontrolled type 2 diabetes mellitus with hyperglycemia, without long-term current use of insulin (HCC) Type 2 diabetes mellitus with hyperglycemia. --Last hemoglobin A1c elevated 8.5.  Take metformin and glipizide at home. --ACHS and SSI --resumed home po  meds (pt and family prefers)        Subjective: out in the recliner. Dter at bedside  Physical Exam: Vitals:   05/19/22 2127 05/20/22 0543 05/20/22 0733 05/20/22 0753  BP: (!) 132/53 (!) 141/68  (!) 143/66  Pulse: (!) 106 (!) 102  (!) 109  Resp: 18 16  20   Temp: 98.2 F (36.8 C) 98 F (36.7 C)  98 F (36.7 C)  TempSrc:    Oral  SpO2: 95% 92% 90% 93%  Weight:      Height:      Constitutional: NAD, alert, oriented to person and place HEENT: conjunctivae and lids normal, EOMI CV: No cyanosis.   RESP: normal respiratory effort, on RA Neuro: II - XII grossly intact.     Family Communication: dter at bedside  Disposition: Status is: Inpatient Remains inpatient appropriate because: Awaiting rehab bed  Planned Discharge Destination: Rehab    Time spent: 30 minutes  Author: , MD 05/20/2022 2:23 PM  For on call review [email or web address].

## 2022-05-21 DIAGNOSIS — A0472 Enterocolitis due to Clostridium difficile, not specified as recurrent: Secondary | ICD-10-CM | POA: Diagnosis not present

## 2022-05-21 LAB — GLUCOSE, CAPILLARY
Glucose-Capillary: 187 mg/dL — ABNORMAL HIGH (ref 70–99)
Glucose-Capillary: 267 mg/dL — ABNORMAL HIGH (ref 70–99)
Glucose-Capillary: 207 mg/dL — ABNORMAL HIGH (ref 70–99)

## 2022-05-21 LAB — POTASSIUM: Potassium: 4.5 mmol/L (ref 3.5–5.1)

## 2022-05-21 MED ORDER — TRAMADOL HCL 50 MG PO TABS: 50.0000 mg | ORAL_TABLET | Freq: Two times a day (BID) | ORAL | 0 refills | Status: AC

## 2022-05-21 MED ORDER — PROPRANOLOL HCL 10 MG PO TABS: 10.0000 mg | ORAL_TABLET | Freq: Three times a day (TID) | ORAL | Status: DC

## 2022-05-21 MED ORDER — POLYSACCHARIDE IRON COMPLEX 150 MG PO CAPS: 150.0000 mg | ORAL_CAPSULE | Freq: Every day | ORAL | 0 refills | Status: AC

## 2022-05-21 MED ORDER — SERTRALINE HCL 100 MG PO TABS: 150.0000 mg | ORAL_TABLET | Freq: Every day | ORAL | 0 refills | Status: AC

## 2022-05-21 MED ORDER — ADULT MULTIVITAMIN W/MINERALS CH: 1.0000 | ORAL_TABLET | Freq: Every day | ORAL | 0 refills | Status: AC

## 2022-05-21 MED ORDER — LEVALBUTEROL HCL 0.63 MG/3ML IN NEBU: 0.6300 mg | INHALATION_SOLUTION | Freq: Three times a day (TID) | RESPIRATORY_TRACT | Status: DC | PRN

## 2022-05-21 MED ORDER — PROPRANOLOL HCL 20 MG PO TABS
20.0000 mg | ORAL_TABLET | Freq: Two times a day (BID) | ORAL | Status: DC
  Administered 2022-05-21: 20 mg via ORAL
  Filled 2022-05-21: qty 1

## 2022-05-21 MED ORDER — IPRATROPIUM BROMIDE 0.02 % IN SOLN: 0.5000 mg | Freq: Three times a day (TID) | RESPIRATORY_TRACT | Status: DC | PRN

## 2022-05-21 MED ORDER — FIDAXOMICIN 200 MG PO TABS: 200.0000 mg | ORAL_TABLET | Freq: Two times a day (BID) | ORAL | 0 refills | Status: DC

## 2022-05-21 NOTE — TOC Progression Note (Signed)
Transition of Care [facility]) - Progression Note    Patient Details  Name: Lisa Davis MRN: [id number] Date of Birth: 1935/11/13  [facility]) CM/SW Contact  Caryn Section, RN Phone Number: 05/21/2022, 3:32 PM  Clinical Narrative:   Patient's family became very upset at her discharge to [facility], stating they do not feel she has ambulated far enough to be discharged.  Family had questions about appealing discharge, was given information from [facility] staff, family called appeal after speaking with TOC and MD.  Patient's family spoke at length with Physical Therapist, and it was decided that patient would go home with home health.  RNCM presented daughter with option of discharging today with home heatlh full services, daugther accepted.  Daughter spoke with other members of patient's family and it was determined that they will cancel appeal with home health.  RNCM contacted Barbara Cower from [facility] who accepted patient for services and will be in to speak with patient and daughter.    Expected Discharge Plan: Skilled Nursing Facility Barriers to Discharge: Continued Medical Work up  Expected Discharge Plan and Services Expected Discharge Plan: Skilled Nursing Facility In-house Referral: Clinical Social Work     Living arrangements for the past 2 months: Apartment Expected Discharge Date: 05/21/22                                     Social Determinants of Health (SDOH) Interventions    Readmission Risk Interventions     No data to display

## 2022-05-21 NOTE — Progress Notes (Signed)
Physical Therapy Treatment Patient Details Name: Lisa Davis MRN: [id number] DOB: June 29, 1936 Today's Date: 05/21/2022   History of Present Illness 86 year old female with history of depression, anxiety, non-insulin-dependent diabetes mellitus, hypertension, neuropathy, hypothyroid, GERD, who presented wth generalized weakness and inability to ambulate.    PT Comments    Pt is making gradual progress towards goals with ability to follow commands and ambulate short distance in room using RW. Still requires heavy verbal/tactile cues and presents with global weakness. Very pleasant and agreeable to therapy. Continue to recommend SNF for continued progress towards deficits. Will continue to progress.   Recommendations for follow up therapy are one component of a multi-disciplinary discharge planning process, led by the attending physician.  Recommendations may be updated based on patient status, additional functional criteria and insurance authorization.  Follow Up Recommendations  Skilled nursing-short term rehab (<3 hours/day) Can patient physically be transported by private vehicle: No   Assistance Recommended at Discharge Frequent or constant Supervision/Assistance  Patient can return home with the following A little help with walking and/or transfers;A little help with bathing/dressing/bathroom;Assistance with cooking/housework;Assist for transportation;Help with stairs or ramp for entrance   Equipment Recommendations  None recommended by PT    Recommendations for Other Services       Precautions / Restrictions Precautions Precautions: Fall Restrictions Weight Bearing Restrictions: No     Mobility  Bed Mobility Overal bed mobility: Needs Assistance Bed Mobility: Supine to Sit     Supine to sit: Min assist     General bed mobility comments: needs cues for sequencing and B LE management. CHux pad used for sliding towards EOB. Once seated, upright posture noted     Transfers Overall transfer level: Needs assistance Equipment used: Rolling walker (2 wheels) Transfers: Sit to/from Stand Sit to Stand: Min assist           General transfer comment: safe technique with use of RW. Cues for hand placement prior to transfer due to visual limitations    Ambulation/Gait Ambulation/Gait assistance: Min assist Gait Distance (Feet): 5 Feet Assistive device: Rolling walker (2 wheels) Gait Pattern/deviations: Step-to pattern       General Gait Details: ambulated with short and shuffle pattern over to [facility] and then to recliner. Needs heavy verbal/tactile cues for sequencing and is very limited due to poor vision. Unaware of L-R directions and tries to sit prior to safe surface.   Stairs             Wheelchair Mobility    Modified Rankin (Stroke Patients Only)       Balance Overall balance assessment: Needs assistance Sitting-balance support: No upper extremity supported, Feet supported Sitting balance-Leahy Scale: Fair     Standing balance support: Bilateral upper extremity supported Standing balance-Leahy Scale: Fair                              Cognition Arousal/Alertness: Awake/alert Behavior During Therapy: WFL for tasks assessed/performed Overall Cognitive Status: History of cognitive impairments - at baseline                                 General Comments: has difficulty sequencing, including differeating between R-L. Per daughter in room, cognition has declined past few months        Exercises Other Exercises Other Exercises: able to void on [facility] with min assist for standing while daughter performed hygiene.  Cues for sequencing and hand placement Other Exercises: supine/seated ther-ex performed on B LE including quad sets, AP, SLRs, and LAQ. 10 reps performed with supervision. Noted B ankle edema present.    General Comments        Pertinent Vitals/Pain Pain Assessment Pain Assessment:  No/denies pain    Home Living                          Prior Function            PT Goals (current goals can now be found in the care plan section) Acute Rehab PT Goals Patient Stated Goal: get stronger PT Goal Formulation: With patient/family Time For Goal Achievement: 05/30/22 Potential to Achieve Goals: Fair Progress towards PT goals: Progressing toward goals    Frequency    Min 2X/week      PT Plan Current plan remains appropriate    Co-evaluation              AM-PAC PT "6 Clicks" Mobility   Outcome Measure  Help needed turning from your back to your side while in a flat bed without using bedrails?: A Little Help needed moving from lying on your back to sitting on the side of a flat bed without using bedrails?: A Little Help needed moving to and from a bed to a chair (including a wheelchair)?: A Little Help needed standing up from a chair using your arms (e.g., wheelchair or bedside chair)?: A Little Help needed to walk in hospital room?: A Lot Help needed climbing 3-5 steps with a railing? : Total 6 Click Score: 15    End of Session Equipment Utilized During Treatment: Gait belt Activity Tolerance: Patient tolerated treatment well Patient left: in chair;with chair alarm set;with family/visitor present Nurse Communication: Mobility status PT Visit Diagnosis: Muscle weakness (generalized) (M62.81);Difficulty in walking, not elsewhere classified (R26.2);Unsteadiness on feet (R26.81);History of falling (Z91.81)     Time: 0940-1003 PT Time Calculation (min) (ACUTE ONLY): 23 min  Charges:  $Therapeutic Exercise: 8-22 mins $Therapeutic Activity: 8-22 mins                     Elizabeth Palau, PT, DPT, GCS [phone]    Makendra Vigeant 05/21/2022, 11:28 AM

## 2022-05-21 NOTE — Discharge Summary (Signed)
Physician Discharge Summary   Patient: Lisa Davis MRN: [id number] DOB: 08-23-1936  Admit date:     05/16/2022  Discharge date: 05/21/22  Discharge Physician: Fritzi Mandes   PCP: Alma Friendly, MD   Recommendations at discharge:    F/u pcp in 1-2 weeks  Discharge Diagnoses: C difficile colitis  Hospital Course:  Lisa Davis is  a 86 year old female with history of depression, anxiety, non-insulin-dependent diabetes mellitus, hypertension, neuropathy, hypothyroid, GERD, who presents emergency department for chief concerns of generalized weakness and inability to ambulate on her own.   Patient was recently diagnosed with C. difficile colitis on 05/07/2022 and has been prescribed vancomycin p.o. 4 times daily, for 10 days completion on 05/18/2022.   The patient still having diarrhea near the end of her vancomycin course, case discussed with ID pharmacist and will switch over to Dificid.     Replacing magnesium and potassium as needed.     Assessment and Plan: * C. difficile colitis Patient still having diarrhea being near the end of her p.o. vancomycin course.  Case discussed with ID pharmacist and will switch over to Dificid.  Cost is only to be $30 and this was sent into her pharmacy. --cont Dificid for 10 days end date 05/27/22 --stools more mushy  Hypotension--resolved   Electrolyte abnormality due to GI losses --repleted   Acute respiratory failure with hypoxia (HCC)-resolved as of 05/20/2022 Pulse ox of 86%.  Unclear etiology.  Not on Home O2.   recieved budesonide and DuoNeb nebulizer treatments. --Continue supplemental O2 to keep sats >=90%, wean as tolerated --resolved   Weakness PT/OT --SNF rehab   Major depressive disorder Depression and anxiety.  On Tranxene and Zoloft.   Acute urinary retention --pt able to urinate   Legally blind Retinitis pigmentosis Gradually worsened since 1984   GERD (gastroesophageal reflux disease) - cont PPI     Hypothyroidism - cont Levothyroxine 75 mcg daily    Uncontrolled type 2 diabetes mellitus with hyperglycemia, without long-term current use of insulin (HCC) Type 2 diabetes mellitus with hyperglycemia. --Last hemoglobin A1c elevated 8.5.  Take metformin and glipizide at home. --ACHS and SSI --resumed home po  meds (pt and family prefers)    overall improving.       Consultants: none Procedures performed: none  Disposition: Rehabilitation facility Diet recommendation:  Discharge Diet Orders (From admission, onward)     Start     Ordered   05/21/22 0000  Diet - low sodium heart healthy        05/21/22 1043   05/21/22 0000  Diet Carb Modified        05/21/22 1043           Cardiac and Carb modified diet DISCHARGE MEDICATION: Allergies as of 05/21/2022       Reactions   Chlorhexidine Rash   Chlorhexidine Gluconate Hives, Rash   Meperidine Nausea And Vomiting, Palpitations   Tachycardia Tachycardia Tachycardia   Statins Other (See Comments)   Intol to several   Meperidine Hcl    Tachycardia   Codeine Nausea And Vomiting   Metformin And Related Diarrhea   Morphine And Related Nausea And Vomiting   Pedi-pre Tape Spray [wound Dressing Adhesive] Rash   Povidone Iodine Rash        Medication List     STOP taking these medications    cyanocobalamin 1000 MCG/ML injection Commonly known as: VITAMIN B12   vancomycin 250 MG capsule Commonly known as: [employer]  TAKE these medications    acetaminophen 325 MG tablet Commonly known as: TYLENOL Take by mouth.   Calcium Carb-Cholecalciferol 600-10 MG-MCG Tabs Take 1 tablet by mouth daily.   clorazepate 3.75 MG tablet Commonly known as: TRANXENE Take 3.75 mg by mouth 2 (two) times daily.   fidaxomicin 200 MG Tabs tablet Commonly known as: DIFICID Take 1 tablet (200 mg total) by mouth 2 (two) times daily for 6 days.   fluticasone 50 MCG/ACT nasal spray Commonly known as: FLONASE Place into the  nose.   glipiZIDE 10 MG 24 hr tablet Commonly known as: GLUCOTROL XL Take 10 mg by mouth 2 (two) times daily. What changed: Another medication with the same name was removed. Continue taking this medication, and follow the directions you see here.   iron polysaccharides 150 MG capsule Commonly known as: NIFEREX Take 1 capsule (150 mg total) by mouth daily. Start taking on: May 22, 2022   latanoprost 0.005 % ophthalmic solution Commonly known as: XALATAN Place 1 drop into both eyes at bedtime.   levothyroxine 75 MCG tablet Commonly known as: SYNTHROID Take 75 mcg by mouth daily before breakfast. What changed: Another medication with the same name was removed. Continue taking this medication, and follow the directions you see here.   losartan 25 MG tablet Commonly known as: COZAAR Take 25 mg by mouth daily.   meclizine 25 MG tablet Commonly known as: ANTIVERT Take 25 mg by mouth 2 (two) times daily.   metFORMIN 500 MG tablet Commonly known as: GLUCOPHAGE Take 1,000 mg by mouth 2 (two) times daily with a meal.   multivitamin with minerals Tabs tablet Take 1 tablet by mouth daily. Start taking on: May 22, 2022   pantoprazole 40 MG tablet Commonly known as: PROTONIX Take 40 mg by mouth daily.   perphenazine-amitriptyline 2-25 MG Tabs tablet Commonly known as: ETRAFON/TRIAVIL Take 2 tablets by mouth at bedtime.   propranolol 20 MG tablet Commonly known as: INDERAL Take 1 tablet by mouth 2 (two) times daily.   sertraline 100 MG tablet Commonly known as: ZOLOFT Take 1.5 tablets (150 mg total) by mouth daily. What changed: how much to take   traMADol 50 MG tablet Commonly known as: ULTRAM Take 1 tablet (50 mg total) by mouth in the morning and at bedtime.   Vitamin D (Ergocalciferol) 1.25 MG (50000 UNIT) Caps capsule Commonly known as: DRISDOL Take 50,000 Units by mouth every 7 (seven) days. Sunday   zolpidem 6.25 MG CR tablet Commonly known as: AMBIEN  CR Take 6.25 mg by mouth at bedtime.        Follow-up Information     Alma Friendly, MD. Schedule an appointment as soon as possible for a visit in 1 week(s).   Specialty: Internal Medicine Why: hospital f/u Contact information: [address] [place] [address] [phone]                Discharge Exam: Danley Danker Weights   05/16/22 1819  Weight: 64.7 kg     Condition at discharge: fair  The results of significant diagnostics from this hospitalization (including imaging, microbiology, ancillary and laboratory) are listed below for reference.   Imaging Studies: DG Chest Port 1 View  Result Date: 05/17/2022 CLINICAL DATA:  Hypoxia. EXAM: PORTABLE CHEST 1 VIEW COMPARISON:  Chest x-ray dated May 03, 2022. FINDINGS: The heart size and mediastinal contours are within normal limits. Normal pulmonary vascularity. Asymmetric interstitial thickening in the left lung appears mildly worsened since the prior study.  No focal consolidation, pleural effusion, or pneumothorax. No acute osseous abnormality. IMPRESSION: 1. Mildly worsened asymmetric interstitial thickening in the left lung, concerning for atypical infection. Electronically Signed   By: Titus Dubin M.D.   On: 05/17/2022 13:23   CT ABDOMEN PELVIS W CONTRAST  Result Date: 05/16/2022 CLINICAL DATA:  Abdominal pain. EXAM: CT ABDOMEN AND PELVIS WITH CONTRAST TECHNIQUE: Multidetector CT imaging of the abdomen and pelvis was performed using the standard protocol following bolus administration of intravenous contrast. RADIATION DOSE REDUCTION: This exam was performed according to the departmental dose-optimization program which includes automated exposure control, adjustment of the mA and/or kV according to patient size and/or use of iterative reconstruction technique. CONTRAST:  130mL OMNIPAQUE IOHEXOL 300 MG/ML  SOLN COMPARISON:  May 06, 2022 FINDINGS: Lower chest: Mosaic attenuation of the lung parenchyma, usually  associated with small airway disease. Hepatobiliary: No focal liver abnormality is seen. Status post cholecystectomy. No biliary dilatation. Pancreas: Unremarkable. No pancreatic ductal dilatation or surrounding inflammatory changes. Spleen: Normal in size without focal abnormality. Adrenals/Urinary Tract: Normal adrenal glands. Normal right kidney. 5 mm nonobstructive calculus in the lower pole of the left kidney. No evidence of hydronephrosis. Normal ureters and urinary bladder. Stomach/Bowel: Stomach is within normal limits. No evidence of appendicitis. No evidence of bowel wall thickening, distention, or inflammatory changes. Left colonic diverticulosis without evidence diverticulitis. Vascular/Lymphatic: No significant vascular findings are present. No enlarged abdominal or pelvic lymph nodes. Reproductive: Status post hysterectomy. No adnexal masses. Other: No abdominal wall hernia or abnormality. No abdominopelvic ascites. Musculoskeletal: Severe compression deformity of L1 vertebral body with approximately 85% height loss centrally. Again seen is 8 mm retropulsion of fracture fragments into the spinal canal. Diffuse spondylosis of the lumbosacral spine. IMPRESSION: 1. No acute abnormalities within the abdomen or pelvis. 2. Left colonic diverticulosis without evidence of diverticulitis. 3. 5 mm nonobstructive calculus in the lower pole of the left kidney. 4. Severe compression deformity of L1 vertebral body with approximately 85% height loss centrally, stable from the CT dated May 06, 2022. Again seen is 8 mm retropulsion of fracture fragments into the spinal canal. Electronically Signed   By: Fidela Salisbury M.D.   On: 05/16/2022 14:29   CT HEAD WO CONTRAST (5MM)  Result Date: 05/16/2022 CLINICAL DATA:  Headache, sudden, severe EXAM: CT HEAD WITHOUT CONTRAST TECHNIQUE: Contiguous axial images were obtained from the base of the skull through the vertex without intravenous contrast. RADIATION DOSE  REDUCTION: This exam was performed according to the departmental dose-optimization program which includes automated exposure control, adjustment of the mA and/or kV according to patient size and/or use of iterative reconstruction technique. COMPARISON:  None Available. FINDINGS: Brain: No evidence of acute infarction, hemorrhage, hydrocephalus, extra-axial collection or mass lesion/mass effect. Partially empty sella. Vascular: No hyperdense vessel identified. Skull: No acute fracture.  Hyperostosis frontalis. Sinuses/Orbits: Clear sinuses.  No acute orbital findings. Other: No mastoid effusions. IMPRESSION: No evidence of acute intracranial abnormality. Electronically Signed   By: Margaretha Sheffield M.D.   On: 05/16/2022 14:23   MR LUMBAR SPINE WO CONTRAST  Result Date: 05/06/2022 CLINICAL DATA:  Low back pain. L1 compression fracture with retropulsed bone. EXAM: MRI LUMBAR SPINE WITHOUT CONTRAST TECHNIQUE: Multiplanar, multisequence MR imaging of the lumbar spine was performed. No intravenous contrast was administered. COMPARISON:  CT of the abdomen and pelvis 05/06/2022 FINDINGS: Segmentation: 5 non rib-bearing lumbar type vertebral bodies are present. The lowest fully formed vertebral body is L5. Alignment: No significant listhesis present. Mild straightening of  the normal lumbar lordosis is present. Vertebrae: A vertebral plana compression fractures present at L1. Edema within the residual bone and in the fluid in the fracture cleft is consistent with acute/subacute fracture. Retropulsed bone extends 8 mm into the spinal canal. Canal is narrowed to 9 mm at the level of fracture. Tip of the conus medullaris at fracture level. Marrow signal and vertebral body heights are otherwise normal. Conus medullaris and cauda equina: Conus extends to the L1 level. Conus and cauda equina appear normal. Paraspinal and other soft tissues: Limited imaging the abdomen is unremarkable. There is no significant adenopathy. No  solid organ lesions are present. Disc levels: T12-L1: Retropulsed bone narrows the central canal is 9 mm. Mild bilateral foraminal stenosis is secondary to retropulsed bone. L1-2: Mild facet hypertrophy is present. No significant disc protrusion or stenosis is present. L2-3: A mild broad-based disc protrusion is present. Mild facet hypertrophy is noted bilaterally. This results in mild bilateral foraminal stenosis. L3-4: A broad-based disc protrusion present. Moderate facet hypertrophy is noted bilaterally. Central canal is patent. Mild foraminal narrowing is present bilaterally. L4-5: Broad-based disc protrusion present. Moderate facet hypertrophy is noted bilaterally. Mild subarticular foraminal narrowing is present bilaterally. L5-S1: Moderate facet hypertrophy is present bilaterally. No significant disc protrusion or stenosis is present. IMPRESSION: 1. Acute/subacute vertebral plana compression fractures at L1 with retropulsed bone resulting in mild central and bilateral foraminal stenosis. 2. Mild bilateral foraminal narrowing at L2-3 and L3-4 secondary to broad-based disc protrusions and bilateral facet hypertrophy. 3. Mild subarticular and foraminal narrowing bilaterally at L4-5. This is the most significant level for disc disease. Electronically Signed   By: Marin Roberts M.D.   On: 05/06/2022 20:02   US Venous Img Lower Bilateral  Result Date: 05/06/2022 CLINICAL DATA:  Bilateral leg swelling EXAM: BILATERAL LOWER EXTREMITY VENOUS DOPPLER ULTRASOUND TECHNIQUE: Gray-scale sonography with graded compression, as well as color Doppler and duplex ultrasound were performed to evaluate the lower extremity deep venous systems from the level of the common femoral vein and including the common femoral, femoral, profunda femoral, popliteal and calf veins including the posterior tibial, peroneal and gastrocnemius veins when visible. The superficial great saphenous vein was also interrogated. Spectral Doppler  was utilized to evaluate flow at rest and with distal augmentation maneuvers in the common femoral, femoral and popliteal veins. COMPARISON:  None Available. FINDINGS: RIGHT LOWER EXTREMITY Common Femoral Vein: No evidence of thrombus. Normal compressibility, respiratory phasicity and response to augmentation. Saphenofemoral Junction: No evidence of thrombus. Normal compressibility and flow on color Doppler imaging. Profunda Femoral Vein: No evidence of thrombus. Normal compressibility and flow on color Doppler imaging. Femoral Vein: No evidence of thrombus. Normal compressibility, respiratory phasicity and response to augmentation. Popliteal Vein: No evidence of thrombus. Normal compressibility, respiratory phasicity and response to augmentation. Calf Veins: No evidence of thrombus. Normal compressibility and flow on color Doppler imaging. Superficial Great Saphenous Vein: No evidence of thrombus. Normal compressibility. Venous Reflux:  None. Other Findings:  None. LEFT LOWER EXTREMITY Common Femoral Vein: No evidence of thrombus. Normal compressibility, respiratory phasicity and response to augmentation. Saphenofemoral Junction: No evidence of thrombus. Normal compressibility and flow on color Doppler imaging. Profunda Femoral Vein: No evidence of thrombus. Normal compressibility and flow on color Doppler imaging. Femoral Vein: No evidence of thrombus. Normal compressibility, respiratory phasicity and response to augmentation. Popliteal Vein: No evidence of thrombus. Normal compressibility, respiratory phasicity and response to augmentation. Calf Veins: No evidence of thrombus. Normal compressibility and flow on color Doppler  imaging. Superficial Great Saphenous Vein: No evidence of thrombus. Normal compressibility. Venous Reflux:  None. Other Findings:  None. IMPRESSION: No evidence of deep venous thrombosis in either lower extremity. Electronically Signed   By: Alcide Clever M.D.   On: 05/06/2022 19:59   CT  Abdomen Pelvis W Contrast  Result Date: 05/06/2022 CLINICAL DATA:  Diffuse abdominal pain, bloating, and diarrhea for 2 days. EXAM: CT ABDOMEN AND PELVIS WITH CONTRAST TECHNIQUE: Multidetector CT imaging of the abdomen and pelvis was performed using the standard protocol following bolus administration of intravenous contrast. RADIATION DOSE REDUCTION: This exam was performed according to the departmental dose-optimization program which includes automated exposure control, adjustment of the mA and/or kV according to patient size and/or use of iterative reconstruction technique. CONTRAST:  OMNIPAQUE IOHEXOL 300 MG/ML  SOLN COMPARISON:  None Available. FINDINGS: Lower Chest: No acute findings. Hepatobiliary: No hepatic masses identified. Prior cholecystectomy. No evidence of biliary obstruction. Pancreas:  No mass or inflammatory changes. Spleen: Within normal limits in size and appearance. Adrenals/Urinary Tract: No masses identified. 8 mm calculus seen in lower pole of left kidney. No evidence of ureteral calculi or hydronephrosis. Stomach/Bowel: No evidence of obstruction, inflammatory process or abnormal fluid collections. Vascular/Lymphatic: No pathologically enlarged lymph nodes. No acute vascular findings. Aortic atherosclerotic calcification incidentally noted. Reproductive: Prior hysterectomy noted. Adnexal regions are unremarkable in appearance. Other:  None. Musculoskeletal: No suspicious bone lesions identified. Right hip prosthesis noted. A severe compression fracture of L1 vertebral body is seen which is likely acute or subacute in age. Retropulsion of bone is seen into the spinal canal at this level measuring 9 mm. IMPRESSION: Severe compression fracture of L1 vertebral body, likely acute or subacute in age. Retropulsion of bone into the spinal canal at this level measures 9 mm. 8 mm nonobstructing left renal calculus. Aortic Atherosclerosis (ICD10-I70.0). Electronically Signed   By: Danae Orleans  M.D.   On: 05/06/2022 17:14   MR BRAIN WO CONTRAST  Result Date: 05/03/2022 CLINICAL DATA:  Follow-up examination for stroke. EXAM: MRI HEAD WITHOUT CONTRAST TECHNIQUE: Multiplanar, multiecho pulse sequences of the brain and surrounding structures were obtained without intravenous contrast. COMPARISON:  Prior CT from earlier the same day. FINDINGS: Brain: Cerebral volume within normal limits for age. Mild hazy FLAIR signal intensity involving the periventricular white matter, most likely related chronic microvascular ischemic disease, overall minimal in nature in felt to be within normal limits for age. No evidence for acute or subacute ischemia. Gray-white matter differentiation maintained. No areas of chronic cortical infarction. No acute or chronic intracranial blood products. No mass lesion, midline shift or mass effect no hydrocephalus or extra-axial fluid collection. Partially empty sella noted. Suprasellar region normal. Vascular: Major intracranial vascular flow voids are maintained. 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 No visible syrinx within the upper cervical spinal cord bone marrow signal intensity within normal limits. Hyperostosis frontalis interna noted. No scalp soft tissue abnormality. Sinuses/Orbits: Globes orbital soft tissues demonstrate no acute finding. Paranasal sinuses are largely clear. No significant mastoid effusion. Other: None. IMPRESSION: 1. No acute intracranial abnormality. 2. 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 3. Otherwise normal brain MRI. Electronically Signed   By: Rise Mu M.D.   On: 05/03/2022 21:18   DG Chest Portable 1 View  Result Date: 05/03/2022 CLINICAL DATA:  Increasing weakness for 2 weeks, hyperglycemia and dizziness  EXAM: PORTABLE CHEST 1 VIEW COMPARISON:  Portable exam 1808 hours without priors  for comparison FINDINGS: Normal heart size, mediastinal contours, and pulmonary vascularity. Lungs clear. No acute infiltrate, pleural effusion, or pneumothorax. Osseous demineralization with LEFT glenohumeral degenerative changes. IMPRESSION: No acute abnormalities. Electronically Signed   By: Lavonia Dana M.D.   On: 05/03/2022 18:29   CT HEAD WO CONTRAST (5MM)  Result Date: 05/03/2022 CLINICAL DATA:  Mental status changes of unknown cause EXAM: CT HEAD WITHOUT CONTRAST TECHNIQUE: Contiguous axial images were obtained from the base of the skull through the vertex without intravenous contrast. RADIATION DOSE REDUCTION: This exam was performed according to the departmental dose-optimization program which includes automated exposure control, adjustment of the mA and/or kV according to patient size and/or use of iterative reconstruction technique. COMPARISON:  None FINDINGS: Brain: Generalized atrophy. Normal ventricular morphology. No midline shift or mass effect. Otherwise normal appearance of brain parenchyma. No intracranial hemorrhage, mass lesion, or evidence of acute infarction. No extra-axial fluid collections. Vascular: Atherosclerotic calcification of internal carotid arteries at skull base. Skull: Osseous demineralization.  Hyperostosis frontalis interna. Sinuses/Orbits: Clear Other: N/A IMPRESSION: Generalized atrophy. No acute intracranial abnormalities. Electronically Signed   By: Lavonia Dana M.D.   On: 05/03/2022 18:25    Microbiology: Results for orders placed or performed during the hospital encounter of 05/07/22  C Difficile Quick Screen w PCR reflex     Status: Abnormal   Collection Time: 05/07/22  1:00 PM   Specimen: STOOL  Result Value Ref Range Status   C Diff antigen POSITIVE (A) NEGATIVE Final   C Diff toxin NEGATIVE NEGATIVE Final   C Diff interpretation Results are indeterminate. See PCR results.  Final    Comment: Performed at [facility], [address]  [place], [place] [address]  Stool culture     Status: None   Collection Time: 05/07/22  1:00 PM   Specimen: Perirectal; Stool  Result Value Ref Range Status   Salmonella/Shigella Screen Final report  Final   Campylobacter Culture Final report  Final   E coli, Shiga toxin Assay Negative Negative Final    Comment: (NOTE) Performed At: [facility] [address] [place], [place] [id number] Rush Farmer MD [id number]   C. Diff by PCR, Reflexed     Status: Abnormal   Collection Time: 05/07/22  1:00 PM  Result Value Ref Range Status   Toxigenic C. Difficile by PCR POSITIVE (A) NEGATIVE Final    Comment: Positive for toxigenic C. difficile with little to no toxin production. Only treat if clinical presentation suggests symptomatic illness. Performed at [facility], [address] [place],  [address]   STOOL CULTURE REFLEX - RSASHR     Status: None   Collection Time: 05/07/22  1:00 PM  Result Value Ref Range Status   Stool Culture result 1 (RSASHR) Comment  Final    Comment: (NOTE) No Salmonella or Shigella recovered. Performed At: [facility] [address] [place], [place] [id number] Rush Farmer MD [id number]   STOOL CULTURE Reflex - CMPCXR     Status: None   Collection Time: 05/07/22  1:00 PM  Result Value Ref Range Status   Stool Culture result 1 (CMPCXR) Comment  Final    Comment: (NOTE) No Campylobacter species isolated. Performed At: [facility] [place], [place] [id number] Rush Farmer MD [id number]     Labs: CBC: Recent Labs  Lab 05/16/22 1202 05/17/22 [id number] 05/18/22 [id number] 05/19/22 0850 05/20/22 [id number]  WBC 9.9 14.5* 6.9 8.1 8.5  NEUTROABS 8.0*  --   --   --   --   HGB 11.3* 11.0* 10.5* 10.8* 10.9*  HCT 36.1 34.1* 33.0* 33.4* 34.0*  MCV 90.5 88.6 88.9 89.3 89.0  PLT 312 309 288 [id number]   Basic Metabolic Panel: Recent Labs  Lab 05/16/22 1202 05/16/22 1840 05/17/22 0459 05/18/22 0852  05/19/22 0850 05/20/22 0708 05/21/22 0429  NA 140  --  139 139 136 140  --   K 3.8  --  2.8* 4.4 5.1 5.3* 4.5  CL 106  --  105 109 107 106  --   CO2 25  --  25 26 26 28   --   GLUCOSE 198*  --  258* 133* 232* 177*  --   BUN 11  --  13 15 12 10   --   CREATININE 0.76  --  0.80 0.64 0.62 0.64  --   CALCIUM 8.5*  --  8.2* 8.0* 8.5* 9.0  --   MG 1.1*  --  1.5* 2.0 1.6* 1.8  --   PHOS  --  2.0* 4.9*  --   --   --   --    Liver Function Tests: Recent Labs  Lab 05/16/22 1202  AST 26  ALT 13  ALKPHOS 81  BILITOT 0.9  PROT 6.2*  ALBUMIN 2.7*   CBG: Recent Labs  Lab 05/20/22 0811 05/20/22 1125 05/20/22 1659 05/20/22 2103 05/21/22 0810  GLUCAP 197* 286* 279* 222* 187*    Discharge time spent: greater than 30 minutes.  Signed: Fritzi Mandes, MD [facility] 05/21/2022

## 2022-05-21 NOTE — Care Management Important Message (Signed)
Important Message  Patient Details  Name: Lisa Davis MRN: [id number] Date of Birth: 03/06/1936   Medicare Important Message Given:  Yes  The patient was resting and I reviewed the Important Message from Medicare with her daughters [name] and Merlene Pulling and they are aware of her rights. I explained the appeal process and left a copy of the form with them so they could discuss their decision. I wished their mother well and thanked them for their time.   Olegario Messier A Abella Shugart 05/21/2022, 12:06 PM

## 2022-05-21 NOTE — Progress Notes (Signed)
PT Contact Note  Extended time talking with family--daughters Cindra Presume and son (un-named).  After observing therapy session this morning and seeing patient's functional limitations, they are concerned with planned transition to [facility] (fearful the facility cannot provide adequate support and concerned about not being able to stay overnight), and prefer to take patient home instead.  They are willing/able to coordinate 24/7 care and would like to have HHPT, OT, RN, aide at discharge; would also appreciate list of area caregiving agencies should they need to consider private aides.  They have RW, BSC and WC; no additional equipment needs at this time. TOC/MD updated and to follow up as appropriate.  Will continue to see for PT/OT throughout remaining hospitalization; will consider family training and integration into subsequent sessions as appropriate and available.  Family would like to prioritize walking/gait distance in future sessions as patient able.  Beanca Kiester H. Manson Passey, PT, DPT, NCS 05/21/22, 1:40 PM [phone]

## 2022-05-21 NOTE — Progress Notes (Signed)
   05/20/22 2010  Assess: MEWS Score  Temp 98 F (36.7 C)  BP 130/78  MAP (mmHg) 93  Pulse Rate (!) 120  Resp 17  SpO2 100 %  O2 Device Room Air  Assess: MEWS Score  MEWS Temp 0  MEWS Systolic 0  MEWS Pulse 2  MEWS RR 0  MEWS LOC 0  MEWS Score 2  MEWS Score Color Yellow  Assess: if the MEWS score is Yellow or Red  Were vital signs taken at a resting state? Yes  Focused Assessment No change from prior assessment  Does the patient meet 2 or more of the SIRS criteria? Yes  Does the patient have a confirmed or suspected source of infection? Yes  Provider and Rapid Response Notified? Yes (Provider on call notified.)  MEWS guidelines implemented *See Row Information* Yes  Treat  MEWS Interventions Other (Comment) (No treatment at this time. Pt asymptomatic.)  Take Vital Signs  Increase Vital Sign Frequency  Yellow: Q 2hr X 2 then Q 4hr X 2, if remains yellow, continue Q 4hrs  Escalate  MEWS: Escalate Yellow: discuss with charge nurse/RN and consider discussing with provider and RRT  Notify: Charge Nurse/RN  Name of Charge Nurse/RN Notified Jackie, RN  Date Charge Nurse/RN Notified 05/20/22  Time Charge Nurse/RN Notified 2025  Notify: Provider  Provider Name/Title Bishop Limbo, NP  Date Provider Notified 05/20/22  Time Provider Notified 2025  Method of Notification Page  Notification Reason Other (Comment) (Yellow MEWS)  Provider response Other (Comment) (Continue to monitor)  Date of Provider Response 05/20/22  Time of Provider Response 2028  Document  Patient Outcome Other (Comment) (Pt asymptomatic. Continue to monitor.)  Progress note created (see row info) Yes  Assess: SIRS CRITERIA  SIRS Temperature  0  SIRS Pulse 1  SIRS Respirations  0  SIRS WBC 1  SIRS Score Sum  2

## 2022-05-21 NOTE — TOC Progression Note (Signed)
Transition of Care [facility]) - Progression Note    Patient Details  Name: Lisa Davis MRN: [id number] Date of Birth: 05/30/1936  Transition of Care [facility]) CM/SW Contact  Caryn Section, RN Phone Number: 05/21/2022, 10:52 AM  Clinical Narrative:  Patient and family chose [facility], can transfer today per Verlon Au.  Family to transport.      Expected Discharge Plan: Skilled Nursing Facility Barriers to Discharge: Continued Medical Work up  Expected Discharge Plan and Services Expected Discharge Plan: Skilled Nursing Facility In-house Referral: Clinical Social Work     Living arrangements for the past 2 months: Apartment Expected Discharge Date: 05/21/22                                     Social Determinants of Health (SDOH) Interventions    Readmission Risk Interventions     No data to display

## 2022-05-22 ENCOUNTER — Inpatient Hospital Stay
Admission: EM | Admit: 2022-05-22 | Discharge: 2022-05-26 | DRG: 948 | Disposition: A | Discharge status: Home Health | Payer: Medicare Other | Attending: Internal Medicine | Admitting: Internal Medicine

## 2022-05-22 ENCOUNTER — Encounter: Payer: Self-pay | Admitting: Emergency Medicine

## 2022-05-22 ENCOUNTER — Other Ambulatory Visit: Payer: Self-pay

## 2022-05-22 DIAGNOSIS — E1165 Type 2 diabetes mellitus with hyperglycemia: Secondary | ICD-10-CM

## 2022-05-22 DIAGNOSIS — Z9049 Acquired absence of other specified parts of digestive tract: Secondary | ICD-10-CM

## 2022-05-22 DIAGNOSIS — R531 Weakness: Secondary | ICD-10-CM | POA: Diagnosis not present

## 2022-05-22 DIAGNOSIS — R1310 Dysphagia, unspecified: Secondary | ICD-10-CM | POA: Diagnosis present

## 2022-05-22 DIAGNOSIS — R262 Difficulty in walking, not elsewhere classified: Secondary | ICD-10-CM | POA: Diagnosis present

## 2022-05-22 DIAGNOSIS — Z96641 Presence of right artificial hip joint: Secondary | ICD-10-CM | POA: Diagnosis present

## 2022-05-22 DIAGNOSIS — D649 Anemia, unspecified: Secondary | ICD-10-CM | POA: Diagnosis present

## 2022-05-22 DIAGNOSIS — Z90710 Acquired absence of both cervix and uterus: Secondary | ICD-10-CM

## 2022-05-22 DIAGNOSIS — F329 Major depressive disorder, single episode, unspecified: Secondary | ICD-10-CM | POA: Diagnosis present

## 2022-05-22 DIAGNOSIS — K219 Gastro-esophageal reflux disease without esophagitis: Secondary | ICD-10-CM | POA: Diagnosis present

## 2022-05-22 DIAGNOSIS — F411 Generalized anxiety disorder: Secondary | ICD-10-CM | POA: Diagnosis present

## 2022-05-22 DIAGNOSIS — I10 Essential (primary) hypertension: Secondary | ICD-10-CM | POA: Diagnosis present

## 2022-05-22 DIAGNOSIS — R5381 Other malaise: Secondary | ICD-10-CM | POA: Diagnosis not present

## 2022-05-22 DIAGNOSIS — Z66 Do not resuscitate: Secondary | ICD-10-CM | POA: Diagnosis present

## 2022-05-22 DIAGNOSIS — H548 Legal blindness, as defined in USA: Secondary | ICD-10-CM | POA: Diagnosis present

## 2022-05-22 DIAGNOSIS — X58XXXA Exposure to other specified factors, initial encounter: Secondary | ICD-10-CM | POA: Diagnosis present

## 2022-05-22 DIAGNOSIS — R300 Dysuria: Secondary | ICD-10-CM | POA: Diagnosis present

## 2022-05-22 DIAGNOSIS — Z794 Long term (current) use of insulin: Secondary | ICD-10-CM

## 2022-05-22 DIAGNOSIS — Z885 Allergy status to narcotic agent status: Secondary | ICD-10-CM

## 2022-05-22 DIAGNOSIS — E785 Hyperlipidemia, unspecified: Secondary | ICD-10-CM | POA: Diagnosis present

## 2022-05-22 DIAGNOSIS — Z20822 Contact with and (suspected) exposure to covid-19: Secondary | ICD-10-CM | POA: Diagnosis present

## 2022-05-22 DIAGNOSIS — S71101A Unspecified open wound, right thigh, initial encounter: Secondary | ICD-10-CM | POA: Diagnosis present

## 2022-05-22 DIAGNOSIS — E1140 Type 2 diabetes mellitus with diabetic neuropathy, unspecified: Secondary | ICD-10-CM | POA: Diagnosis present

## 2022-05-22 DIAGNOSIS — Z7984 Long term (current) use of oral hypoglycemic drugs: Secondary | ICD-10-CM

## 2022-05-22 DIAGNOSIS — Z79890 Hormone replacement therapy (postmenopausal): Secondary | ICD-10-CM

## 2022-05-22 DIAGNOSIS — M549 Dorsalgia, unspecified: Secondary | ICD-10-CM | POA: Diagnosis present

## 2022-05-22 DIAGNOSIS — I959 Hypotension, unspecified: Secondary | ICD-10-CM | POA: Diagnosis present

## 2022-05-22 DIAGNOSIS — G8929 Other chronic pain: Secondary | ICD-10-CM | POA: Diagnosis present

## 2022-05-22 DIAGNOSIS — A0472 Enterocolitis due to Clostridium difficile, not specified as recurrent: Secondary | ICD-10-CM | POA: Diagnosis present

## 2022-05-22 DIAGNOSIS — F419 Anxiety disorder, unspecified: Secondary | ICD-10-CM | POA: Diagnosis present

## 2022-05-22 DIAGNOSIS — K5900 Constipation, unspecified: Secondary | ICD-10-CM | POA: Diagnosis present

## 2022-05-22 DIAGNOSIS — E039 Hypothyroidism, unspecified: Secondary | ICD-10-CM | POA: Diagnosis present

## 2022-05-22 DIAGNOSIS — R32 Unspecified urinary incontinence: Secondary | ICD-10-CM | POA: Diagnosis present

## 2022-05-22 DIAGNOSIS — Z888 Allergy status to other drugs, medicaments and biological substances status: Secondary | ICD-10-CM

## 2022-05-22 DIAGNOSIS — R339 Retention of urine, unspecified: Secondary | ICD-10-CM | POA: Diagnosis present

## 2022-05-22 LAB — CBC
HCT: 38.1 % (ref 36.0–46.0)
Hemoglobin: 11.8 g/dL — ABNORMAL LOW (ref 12.0–15.0)
MCH: 28.4 pg (ref 26.0–34.0)
MCHC: 31 g/dL (ref 30.0–36.0)
MCV: 91.8 fL (ref 80.0–100.0)
Platelets: 415 10*3/uL — ABNORMAL HIGH (ref 150–400)
RBC: 4.15 MIL/uL (ref 3.87–5.11)
RDW: 15.5 % (ref 11.5–15.5)
WBC: 12 10*3/uL — ABNORMAL HIGH (ref 4.0–10.5)
nRBC: 0 % (ref 0.0–0.2)

## 2022-05-22 LAB — BASIC METABOLIC PANEL
Anion gap: 8 (ref 5–15)
BUN: 19 mg/dL (ref 8–23)
CO2: 27 mmol/L (ref 22–32)
Calcium: 9 mg/dL (ref 8.9–10.3)
Chloride: 99 mmol/L (ref 98–111)
Creatinine, Ser: 0.71 mg/dL (ref 0.44–1.00)
GFR, Estimated: >60 mL/min (ref >60)
Glucose, Bld: 324 mg/dL — ABNORMAL HIGH (ref 70–99)
Potassium: 4.4 mmol/L (ref 3.5–5.1)
Sodium: 134 mmol/L — ABNORMAL LOW (ref 135–145)

## 2022-05-22 LAB — HEPATIC FUNCTION PANEL
ALT: 19 U/L (ref 0–44)
AST: 23 U/L (ref 15–41)
Albumin: 2.8 g/dL — ABNORMAL LOW (ref 3.5–5.0)
Alkaline Phosphatase: 114 U/L (ref 38–126)
Bilirubin, Direct: 0.1 mg/dL (ref 0.0–0.2)
Indirect Bilirubin: 0.4 mg/dL (ref 0.3–0.9)
Total Bilirubin: 0.5 mg/dL (ref 0.3–1.2)
Total Protein: 6.4 g/dL — ABNORMAL LOW (ref 6.5–8.1)

## 2022-05-22 LAB — RESP PANEL BY RT-PCR (FLU A&B, COVID) ARPGX2
Influenza A by PCR: NEGATIVE
Influenza B by PCR: NEGATIVE
SARS Coronavirus 2 by RT PCR: NEGATIVE

## 2022-05-22 LAB — CBG MONITORING, ED
Glucose-Capillary: 283 mg/dL — ABNORMAL HIGH (ref 70–99)
Glucose-Capillary: 238 mg/dL — ABNORMAL HIGH (ref 70–99)

## 2022-05-22 MED ORDER — SODIUM CHLORIDE 0.9 % IV BOLUS: 500.0000 mL | Freq: Once | INTRAVENOUS | Status: DC

## 2022-05-22 MED ORDER — TRAMADOL HCL 50 MG PO TABS
50.0000 mg | ORAL_TABLET | Freq: Two times a day (BID) | ORAL | Status: DC
  Administered 2022-05-22 – 2022-05-26 (×8): 50 mg via ORAL
  Filled 2022-05-22 (×8): qty 1

## 2022-05-22 MED ORDER — AMITRIPTYLINE HCL 25 MG PO TABS
50.0000 mg | ORAL_TABLET | Freq: Every day | ORAL | Status: DC
  Administered 2022-05-22 – 2022-05-25 (×4): 50 mg via ORAL
  Filled 2022-05-22 (×2): qty 2
  Filled 2022-05-22: qty 1
  Filled 2022-05-22: qty 2

## 2022-05-22 MED ORDER — SODIUM CHLORIDE 0.9 % IV BOLUS
1000.0000 mL | Freq: Once | INTRAVENOUS | Status: AC
  Administered 2022-05-22: 1000 mL via INTRAVENOUS

## 2022-05-22 MED ORDER — ZOLPIDEM TARTRATE 5 MG PO TABS
5.0000 mg | ORAL_TABLET | Freq: Every day | ORAL | Status: DC
  Administered 2022-05-22 – 2022-05-25 (×4): 5 mg via ORAL
  Filled 2022-05-22 (×4): qty 1

## 2022-05-22 MED ORDER — LOSARTAN POTASSIUM 25 MG PO TABS
25.0000 mg | ORAL_TABLET | Freq: Every day | ORAL | Status: DC
  Administered 2022-05-23 – 2022-05-26 (×4): 25 mg via ORAL
  Filled 2022-05-22 (×4): qty 1

## 2022-05-22 MED ORDER — GLIPIZIDE ER 10 MG PO TB24
10.0000 mg | ORAL_TABLET | Freq: Two times a day (BID) | ORAL | Status: DC
  Administered 2022-05-22: 10 mg via ORAL
  Filled 2022-05-22: qty 1

## 2022-05-22 MED ORDER — FIDAXOMICIN 200 MG PO TABS
200.0000 mg | ORAL_TABLET | Freq: Two times a day (BID) | ORAL | Status: DC
  Administered 2022-05-22 – 2022-05-26 (×8): 200 mg via ORAL
  Filled 2022-05-22 (×9): qty 1

## 2022-05-22 MED ORDER — PERPHENAZINE-AMITRIPTYLINE 2-25 MG PO TABS: 2.0000 | ORAL_TABLET | Freq: Every day | ORAL | Status: DC

## 2022-05-22 MED ORDER — PANTOPRAZOLE SODIUM 40 MG PO TBEC: 40.0000 mg | DELAYED_RELEASE_TABLET | Freq: Every day | ORAL | Status: DC

## 2022-05-22 MED ORDER — CLORAZEPATE DIPOTASSIUM 3.75 MG PO TABS
3.7500 mg | ORAL_TABLET | Freq: Two times a day (BID) | ORAL | Status: DC
  Administered 2022-05-22 – 2022-05-26 (×8): 3.75 mg via ORAL
  Filled 2022-05-22 (×8): qty 1

## 2022-05-22 MED ORDER — OYSTER SHELL CALCIUM/D3 500-5 MG-MCG PO TABS
1.0000 | ORAL_TABLET | Freq: Every day | ORAL | Status: DC
  Administered 2022-05-23 – 2022-05-26 (×4): 1 via ORAL
  Filled 2022-05-22 (×4): qty 1

## 2022-05-22 MED ORDER — METFORMIN HCL 500 MG PO TABS
1000.0000 mg | ORAL_TABLET | Freq: Two times a day (BID) | ORAL | Status: DC
  Administered 2022-05-22: 1000 mg via ORAL
  Filled 2022-05-22: qty 2

## 2022-05-22 MED ORDER — LEVOTHYROXINE SODIUM 25 MCG PO TABS
75.0000 ug | ORAL_TABLET | Freq: Every day | ORAL | Status: DC
  Administered 2022-05-23 – 2022-05-26 (×4): 75 ug via ORAL
  Filled 2022-05-22 (×4): qty 1

## 2022-05-22 MED ORDER — SERTRALINE HCL 50 MG PO TABS
150.0000 mg | ORAL_TABLET | Freq: Every day | ORAL | Status: DC
  Administered 2022-05-23 – 2022-05-26 (×4): 150 mg via ORAL
  Filled 2022-05-22 (×4): qty 3

## 2022-05-22 MED ORDER — ADULT MULTIVITAMIN W/MINERALS CH
1.0000 | ORAL_TABLET | Freq: Every day | ORAL | Status: DC
  Administered 2022-05-22 – 2022-05-26 (×5): 1 via ORAL
  Filled 2022-05-22 (×5): qty 1

## 2022-05-22 MED ORDER — PERPHENAZINE 4 MG PO TABS
4.0000 mg | ORAL_TABLET | Freq: Every day | ORAL | Status: DC
Start: 2022-05-22 — End: 2022-05-26
  Administered 2022-05-22 – 2022-05-25 (×4): 4 mg via ORAL
  Filled 2022-05-22 (×4): qty 1

## 2022-05-22 MED ORDER — LATANOPROST 0.005 % OP SOLN
1.0000 [drp] | Freq: Every day | OPHTHALMIC | Status: DC
  Administered 2022-05-22 – 2022-05-25 (×3): 1 [drp] via OPHTHALMIC
  Filled 2022-05-22 (×2): qty 2.5

## 2022-05-22 MED ORDER — POLYSACCHARIDE IRON COMPLEX 150 MG PO CAPS
150.0000 mg | ORAL_CAPSULE | Freq: Every day | ORAL | Status: DC
  Administered 2022-05-23 – 2022-05-26 (×4): 150 mg via ORAL
  Filled 2022-05-22 (×5): qty 1

## 2022-05-22 MED ORDER — PROPRANOLOL HCL 20 MG PO TABS
20.0000 mg | ORAL_TABLET | Freq: Two times a day (BID) | ORAL | Status: DC
  Administered 2022-05-22 – 2022-05-26 (×8): 20 mg via ORAL
  Filled 2022-05-22 (×8): qty 1

## 2022-05-22 MED ORDER — FLUTICASONE PROPIONATE 50 MCG/ACT NA SUSP
1.0000 | Freq: Every day | NASAL | Status: DC
  Administered 2022-05-24 – 2022-05-26 (×3): 1 via NASAL
  Filled 2022-05-22 (×2): qty 16

## 2022-05-22 MED ORDER — ACETAMINOPHEN 325 MG PO TABS: 325.0000 mg | ORAL_TABLET | ORAL | Status: DC | PRN

## 2022-05-22 NOTE — ED Notes (Signed)
Meds given with applesauce, pt tolerated well.

## 2022-05-22 NOTE — H&P (Incomplete)
History and Physical    Lisa Davis [id number] DOB: Jan 08, 1936 DOA: 05/22/2022  PCP: Alma Friendly, MD  Patient coming from:  home  I have personally briefly reviewed patient's old medical records in [place]  Chief Complaint: weakness /placement   HPI: Lisa Davis is a 86 y.o. female with medical history significant of  depression, anxiety, non-insulin-dependent diabetes mellitus, hypertension, neuropathy, hypothyroid, GERD,C-dif infection, who has interim history of recent admission  05/16/22-05/21/22 for generalized weakness and inability to ambulate on her own. Of note patient also has history diagnosis of C-dif colitis on 05/07/2022 for which she was treated with po vanc qid x 10 days with completion of course onf 05/18/2022. ON return to ED  on 8/9 patient was noted to have continued diarrhea and was switched to Dficid for 10 days with end dated of 05/27/22. Patient prior to discharge was evaluated by PT who recommended SNF however family declined placement and patient was discharged home. Patient now return less than 24 hours later with family noting that patient is 3 person assist and continues to have profuse diarrhea. They have noted that they are unable to provide the care for her at home, so she was brought back to ED.  Patient daughter at bedside who give history notes, patient has continued increase stools at home but no complaints of fever chills, no n/v/abd pain or sob.However notes severe generalize weakness .   ED Course:  Vitals:  afeb, bp 121/90, hr 85, sat 94%  Covid:neg    Labs Wbc 12, hgb 11.8, plt 415 NA 134 (140),gluc 324, cr  0.71  Tx 1500 ccNS  Review of Systems: As per HPI otherwise 10 point review of systems negative.   Past Medical History:  Diagnosis Date   Diabetes mellitus ([place])    Hypertension     History reviewed. No pertinent surgical history.   reports that she has never smoked. She has never used smokeless tobacco. She  reports that she does not drink alcohol and does not use drugs.  Allergies  Allergen Reactions   Chlorhexidine Rash   Chlorhexidine Gluconate Hives and Rash   Meperidine Nausea And Vomiting and Palpitations    Tachycardia Tachycardia Tachycardia    Statins Other (See Comments)    Intol to several   Meperidine Hcl     Tachycardia   Codeine Nausea And Vomiting   Metformin And Related Diarrhea   Morphine And Related Nausea And Vomiting   Pedi-Pre Tape Spray [Wound Dressing Adhesive] Rash   Povidone Iodine Rash    No family history on file.  Prior to Admission medications   Medication Sig Start Date End Date Taking? Authorizing Provider  acetaminophen (TYLENOL) 325 MG tablet Take 325 mg by mouth as needed.   Yes [provider]  clorazepate (TRANXENE) 3.75 MG tablet Take 3.75 mg by mouth 2 (two) times daily. 03/19/19  Yes [provider]  fidaxomicin (DIFICID) 200 MG TABS tablet Take 1 tablet (200 mg total) by mouth 2 (two) times daily for 6 days. 05/21/22 05/27/22 Yes Fritzi Mandes, MD  glipiZIDE (GLUCOTROL XL) 10 MG 24 hr tablet Take 10 mg by mouth 2 (two) times daily.   Yes [provider]  iron polysaccharides (NIFEREX) 150 MG capsule Take 1 capsule (150 mg total) by mouth daily. 05/22/22  Yes Fritzi Mandes, MD  latanoprost (XALATAN) 0.005 % ophthalmic solution Place 1 drop into both eyes at bedtime. 05/28/15  Yes [provider]  levothyroxine (SYNTHROID) 75 MCG tablet Take  75 mcg by mouth daily before breakfast. 03/10/20  Yes [provider]  losartan (COZAAR) 25 MG tablet Take 25 mg by mouth daily. 07/13/21  Yes [provider]  metFORMIN (GLUCOPHAGE) 500 MG tablet Take 1,000 mg by mouth 2 (two) times daily with a meal. 12/01/19  Yes [provider]  pantoprazole (PROTONIX) 40 MG tablet Take 40 mg by mouth daily. 08/25/15  Yes [provider]  perphenazine-amitriptyline (ETRAFON/TRIAVIL) 2-25 MG TABS tablet Take 2  tablets by mouth at bedtime. 03/08/15  Yes [provider]  propranolol (INDERAL) 20 MG tablet Take 1 tablet by mouth 2 (two) times daily. 06/04/16  Yes [provider]  sertraline (ZOLOFT) 100 MG tablet Take 1.5 tablets (150 mg total) by mouth daily. 05/21/22  Yes Enedina Finner, MD  traMADol (ULTRAM) 50 MG tablet Take 1 tablet (50 mg total) by mouth in the morning and at bedtime. 05/21/22  Yes Enedina Finner, MD  zolpidem (AMBIEN CR) 6.25 MG CR tablet Take 6.25 mg by mouth at bedtime. 05/23/15  Yes [provider]  Multiple Vitamin (MULTIVITAMIN WITH MINERALS) TABS tablet Take 1 tablet by mouth daily. Patient not taking: Reported on 05/22/2022 05/22/22   Enedina Finner, MD  Vitamin D, Ergocalciferol, (DRISDOL) 1.25 MG (50000 UNIT) CAPS capsule Take 50,000 Units by mouth every 7 (seven) days. Sunday    [provider]    Physical Exam: Vitals:   05/22/22 1730 05/22/22 1800 05/22/22 1900 05/22/22 2000  BP: (!) 123/57 132/63 115/60 134/74  Pulse: 81 84 84 83  Resp:   20 20  Temp:      TempSrc:      SpO2: 95% 95% 94% 97%  Weight:        Vitals:   05/22/22 1730 05/22/22 1800 05/22/22 1900 05/22/22 2000  BP: (!) 123/57 132/63 115/60 134/74  Pulse: 81 84 84 83  Resp:   20 20  Temp:      TempSrc:      SpO2: 95% 95% 94% 97%  Weight:       Constitutional: NAD, calm, comfortable pale Eyes: PERRL, lids and conjunctivae normal ENMT: Mucous membranes are moist. Posterior pharynx clear of any exudate or lesions.Normal dentition.  Neck: normal, supple, no masses, no thyromegaly Respiratory: clear to auscultation bilaterally, no wheezing, no crackles. Normal respiratory effort. No accessory muscle use.  Cardiovascular: Regular rate and rhythm, no murmurs / rubs / gallops. Trace to 1+ extremity edema. + pedal pulses.  Abdomen: no tenderness, no masses palpated. No hepatosplenomegaly. Bowel sounds positive.  Musculoskeletal: no clubbing / cyanosis. No joint deformity upper  and lower extremities. Good ROM, no contractures. Normal muscle tone.  Skin: no rashes, lesions, ulcers. No induration Neurologic: CN 2-12 grossly intact. Sensation intact,  Strength 5/5 in all 4.  Psychiatric:Alert and oriented x 3. Normal mood.    Labs on Admission: I have personally reviewed following labs and imaging studies  CBC: Recent Labs  Lab 05/16/22 1202 05/17/22 0459 05/18/22 0852 05/19/22 0850 05/20/22 0708 05/22/22 1218  WBC 9.9 14.5* 6.9 8.1 8.5 12.0*  NEUTROABS 8.0*  --   --   --   --   --   HGB 11.3* 11.0* 10.5* 10.8* 10.9* 11.8*  HCT 36.1 34.1* 33.0* 33.4* 34.0* 38.1  MCV 90.5 88.6 88.9 89.3 89.0 91.8  PLT 312 309 288 309 312 415*   Basic Metabolic Panel: Recent Labs  Lab 05/16/22 1202 05/16/22 1840 05/17/22 0459 05/18/22 0852 05/19/22 0850 05/20/22 0708 05/21/22 0429 05/22/22  1218  NA 140  --  139 139 136 140  --  134*  K 3.8  --  2.8* 4.4 5.1 5.3* 4.5 4.4  CL 106  --  105 109 107 106  --  99  CO2 25  --  25 26 26 28   --  27  GLUCOSE 198*  --  258* 133* 232* 177*  --  324*  BUN 11  --  13 15 12 10   --  19  CREATININE 0.76  --  0.80 0.64 0.62 0.64  --  0.71  CALCIUM 8.5*  --  8.2* 8.0* 8.5* 9.0  --  9.0  MG 1.1*  --  1.5* 2.0 1.6* 1.8  --   --   PHOS  --  2.0* 4.9*  --   --   --   --   --    GFR: Estimated Creatinine Clearance: 50.1 mL/min (by C-G formula based on SCr of 0.71 mg/dL). Liver Function Tests: Recent Labs  Lab 05/16/22 1202 05/22/22 1218  AST 26 23  ALT 13 19  ALKPHOS 81 114  BILITOT 0.9 0.5  PROT 6.2* 6.4*  ALBUMIN 2.7* 2.8*   No results for input(s): "LIPASE", "AMYLASE" in the last 168 hours. No results for input(s): "AMMONIA" in the last 168 hours. Coagulation Profile: No results for input(s): "INR", "PROTIME" in the last 168 hours. Cardiac Enzymes: No results for input(s): "CKTOTAL", "CKMB", "CKMBINDEX", "TROPONINI" in the last 168 hours. BNP (last 3 results) No results for input(s): "PROBNP" in the last 8760  hours. HbA1C: No results for input(s): "HGBA1C" in the last 72 hours. CBG: Recent Labs  Lab 05/21/22 0810 05/21/22 1205 05/21/22 1621 05/22/22 1249 05/22/22 1725  GLUCAP 187* 267* 207* 283* 238*   Lipid Profile: No results for input(s): "CHOL", "HDL", "LDLCALC", "TRIG", "CHOLHDL", "LDLDIRECT" in the last 72 hours. Thyroid Function Tests: No results for input(s): "TSH", "T4TOTAL", "FREET4", "T3FREE", "THYROIDAB" in the last 72 hours. Anemia Panel: No results for input(s): "VITAMINB12", "FOLATE", "FERRITIN", "TIBC", "IRON", "RETICCTPCT" in the last 72 hours. Urine analysis:    Component Value Date/Time   COLORURINE YELLOW (A) 05/06/2022 2059   APPEARANCEUR HAZY (A) 05/06/2022 2059   APPEARANCEUR Clear 04/26/2022 1500   LABSPEC 1.036 (H) 05/06/2022 2059   PHURINE 5.0 05/06/2022 2059   GLUCOSEU NEGATIVE 05/06/2022 2059   HGBUR NEGATIVE 05/06/2022 2059   BILIRUBINUR NEGATIVE 05/06/2022 2059   BILIRUBINUR Negative 04/26/2022 1500   KETONESUR NEGATIVE 05/06/2022 2059   PROTEINUR NEGATIVE 05/06/2022 2059   NITRITE NEGATIVE 05/06/2022 2059   LEUKOCYTESUR SMALL (A) 05/06/2022 2059    Radiological Exams on Admission: No results found.  EKG: Independently reviewed.  Assessment/Plan  Acute on Chronic debility -due to  persistent C-dif infection  -patient continue to have increase stools -weakness persists  - PT/OT -social work to assist with placement for rehab  Persistent C-dif infection  - continue  Dficid/ end date 8/20 -consider ID consult in am   Uncontrolled DMII -fs 300's  -insetting of persistent infection  -last a1c 8.5  -hold po medications  - place on iss/fs   Hypothyroidism -resume levothyroxine 2060 daily   Major Depression  Anxiety -continue on Tranxene/Zoloft   Hx of Acute urinary retention - monitor uop  -strict I/o  -monitor for signs of retention   GERD -H2  -hold ppi due to c-dif   Hypertension -continue Losartan   -propanolol   FEN -electrolytes stable, monitor and replete prn  -mild hyponatremia- monitor trend , f/u  s/p ivfs   DVT prophylaxis:  heparin Code Status: full Family Communication: daughter at [place] (Daughter)  [phone] (Mobile) Disposition Plan: patient  expected to be admitted greater than 2 midnights  Consults called: consider infectious disease consult Admission status: medical tele   Clance Boll MD [facility]   If 7PM-7AM, please contact night-coverage [email or web address] Password [facility]  05/22/2022, 11:38 PM

## 2022-05-22 NOTE — ED Triage Notes (Signed)
First nurse note: Patient arrived by [facility] from home for weakness. Reports baseline she can walk with walker and this morning unable to. Patient was discharged from [facility] last night after being admitted for c-diff.   Patient is legally blind.   EMS vitals: 367 blood sugar 121/65 b/p 90HR

## 2022-05-22 NOTE — ED Provider Triage Note (Signed)
Emergency Medicine Provider Triage Evaluation Note  Lisa Davis , a 86 y.o. female  was evaluated in triage.  Pt complains of weakness.  Just discharged from hospital yesterday for COVID.  Review of Systems  Positive: Weakness Negative: Fever  Physical Exam  BP 119/69 (BP Location: Right Arm)   Pulse 89   Temp 98.2 F (36.8 C) (Oral)   Resp 17   SpO2 96%  Gen:   Awake, no distress   Resp:  Normal effort  MSK:   Moves extremities without difficulty  Other:    Medical Decision Making  Medically screening exam initiated at 12:52 PM.  Appropriate orders placed.  Lisa Davis was informed that the remainder of the evaluation will be completed by another provider, this initial triage assessment does not replace that evaluation, and the importance of remaining in the ED until their evaluation is complete.     Faythe Ghee, PA-C 05/22/22 1252

## 2022-05-22 NOTE — ED Notes (Addendum)
Assisted patient with bed change due to incontinence of urine and BM. All bedding changed. Pt cleaned up, peri cares provided. New purewick placed. Pt placed in new hospital gown and given warm blankets. Pt requesting night time medications now. Unable to obtain urine specimen due to stool incontinence as well.

## 2022-05-22 NOTE — ED Provider Notes (Signed)
[facility] Provider Note    Event Date/Time   First MD Initiated Contact with Patient 05/22/22 1502     (approximate)   History   Weakness   HPI  Lisa Davis is a 85 y.o. female   with past medical history of diabetes and hypertension recent admission for C. difficile who presents with inability to ambulate and generalized weakness.  Patient was admitted for diarrhea found to have C. difficile completed course of oral vancomycin but did have ongoing diarrhea towards the time of discharge so was started on fidaxomixin  Prescribed for total of 6 days.  Physical therapy evaluated the patient and she was referred for SNF.  However family tells me that when they went to talk to [facility] they were told that they are not a hospital and patient will need to be able to get herself up to go to the bathroom and her daughter ultimately did not think that she was safe to go to [facility] and so she decided to take the patient home.  Patient's daughter appealed the discharge but ultimately canceled the appeal.  Home health has been arranged has not yet started.  Not surprisingly the patient's family was unable to get her up today without the assistance of multiple family members.  She continues to have diarrhea about 2-3 episodes per day does have chronic back pain no new abdominal pain no fevers has been not eating very much since discharge just yesterday at 5 PM.     Past Medical History:  Diagnosis Date   Diabetes mellitus (HCC)    Hypertension     Patient Active Problem List   Diagnosis Date Noted   Hypokalemia 05/17/2022   Acute urinary retention 05/17/2022   Hypotension 05/17/2022   Weakness 05/16/2022   Uncontrolled type 2 diabetes mellitus with hyperglycemia, without long-term current use of insulin (HCC) 05/16/2022   Hypothyroidism 05/16/2022   GERD (gastroesophageal reflux disease) 05/16/2022   Generalized anxiety disorder 05/16/2022   Major  depressive disorder 05/16/2022   Dyslipidemia 05/16/2022   C. difficile colitis 05/16/2022   Hypomagnesemia 05/16/2022   Legally blind 05/16/2022   Hypophosphatemia 05/16/2022     Physical Exam  Triage Vital Signs: ED Triage Vitals  Enc Vitals Group     BP 05/22/22 1209 119/69     Pulse Rate 05/22/22 1209 89     Resp 05/22/22 1209 17     Temp 05/22/22 1209 98.2 F (36.8 C)     Temp Source 05/22/22 1209 Oral     SpO2 05/22/22 1209 96 %     Weight --      Height --      Head Circumference --      Peak Flow --      Pain Score 05/22/22 1210 0     Pain Loc --      Pain Edu? --      Excl. in GC? --     Most recent vital signs: Vitals:   05/22/22 1900 05/22/22 2000  BP: 115/60 134/74  Pulse: 84 83  Resp: 20 20  Temp:    SpO2: 94% 97%     General: Awake, no distress.  CV:  Good peripheral perfusion.  Edema bilaterally symmetric Resp:  Normal effort.  No increased work of breathing Abd:  No distention.  Abdomen is soft and nontender Neuro:             Awake, Alert, Oriented x 3  Other:  Dry mucous membranes   ED Results / Procedures / Treatments  Labs (all labs ordered are listed, but only abnormal results are displayed) Labs Reviewed  BASIC METABOLIC PANEL - Abnormal; Notable for the following components:      Result Value   Sodium 134 (*)    Glucose, Bld 324 (*)    All other components within normal limits  CBC - Abnormal; Notable for the following components:   WBC 12.0 (*)    Hemoglobin 11.8 (*)    Platelets 415 (*)    All other components within normal limits  HEPATIC FUNCTION PANEL - Abnormal; Notable for the following components:   Total Protein 6.4 (*)    Albumin 2.8 (*)    All other components within normal limits  CBG MONITORING, ED - Abnormal; Notable for the following components:   Glucose-Capillary 283 (*)    All other components within normal limits  CBG MONITORING, ED - Abnormal; Notable for the following components:   Glucose-Capillary 238  (*)    All other components within normal limits  RESP PANEL BY RT-PCR (FLU A&B, COVID) [id number]  URINALYSIS, ROUTINE W REFLEX MICROSCOPIC     EKG  EKG reviewed interpreted by myself shows left axis deviation normal sinus rhythm normal intervals no acute ischemic changes   RADIOLOGY    PROCEDURES:  Critical Care performed: No  Procedures  The patient is on the cardiac monitor to evaluate for evidence of arrhythmia and/or significant heart rate changes.   MEDICATIONS ORDERED IN ED: Medications  acetaminophen (TYLENOL) tablet 325 mg (has no administration in time range)  clorazepate (TRANXENE) tablet 3.75 mg (3.75 mg Oral Given 05/22/22 2115)  calcium-vitamin D (OSCAL WITH D) 500-5 MG-MCG per tablet 1 tablet (1 tablet Oral Not Given 05/22/22 1614)  fidaxomicin (DIFICID) tablet 200 mg (200 mg Oral Given 05/22/22 2115)  fluticasone (FLONASE) 50 MCG/ACT nasal spray 1 spray (0 sprays Each Nare Hold 05/22/22 1926)  glipiZIDE (GLUCOTROL XL) 24 hr tablet 10 mg (10 mg Oral Given 05/22/22 2115)  iron polysaccharides (NIFEREX) capsule 150 mg (0 mg Oral Hold 05/22/22 1926)  latanoprost (XALATAN) 0.005 % ophthalmic solution 1 drop (1 drop Both Eyes Given 05/22/22 2116)  levothyroxine (SYNTHROID) tablet 75 mcg (has no administration in time range)  losartan (COZAAR) tablet 25 mg (25 mg Oral Not Given 05/22/22 1614)  metFORMIN (GLUCOPHAGE) tablet 1,000 mg (1,000 mg Oral Given 05/22/22 1721)  multivitamin with minerals tablet 1 tablet (1 tablet Oral Given 05/22/22 1721)  pantoprazole (PROTONIX) EC tablet 40 mg (40 mg Oral Not Given 05/22/22 1613)  propranolol (INDERAL) tablet 20 mg (20 mg Oral Given 05/22/22 1721)  sertraline (ZOLOFT) tablet 150 mg (150 mg Oral Not Given 05/22/22 1611)  zolpidem (AMBIEN) tablet 5 mg (5 mg Oral Given 05/22/22 2116)  traMADol (ULTRAM) tablet 50 mg (50 mg Oral Given 05/22/22 1720)  perphenazine (TRILAFON) tablet 4 mg (4 mg Oral Given 05/22/22 2116)  amitriptyline (ELAVIL)  tablet 50 mg (50 mg Oral Given 05/22/22 2116)  sodium chloride 0.9 % bolus 1,000 mL (0 mLs Intravenous Stopped 05/22/22 1926)     IMPRESSION / MDM / ASSESSMENT AND PLAN / ED COURSE  I reviewed the triage vital signs and the nursing notes.                              Patient's presentation is most consistent with acute, uncomplicated illness.  Differential diagnosis includes, but is not limited  to, deconditioning from recent hospitalization, electrode abnormality, hypovolemia, renal failure, AKI, C. difficile colitis  The patient is a 86 year old female who was recently admitted for 5 days for C. difficile treated with both oral vancomycin and discharged on for daptomycin presenting today because of inability to ambulate unassisted.  Patient was seen by physical therapy in the hospital and it was recommended that she go to SNF for acute rehab.  Patient's daughter ultimately decided not to take her to SNF and her daughter tells me this is because when she talked to [facility] they told her that patient would need to be able to care for herself and she did not think the patient was appropriate to go to rehab felt that she needed to stay in the hospital longer.  Patient daughter had initially said she was going to appeal the discharge but ultimately did not.  She is brought in today because her daughter would like her to be admitted.  Patient's vitals are within normal limits.  She does have mild leukocytosis to 12, mildly hyperglycemic to 320 but normal anion gap normal bicarb.  Potassium and renal function are normal.  Patient denies abdominal pain abdomen is benign but does complain of chronic back pain but this is not a new issue for her.  She is alert and oriented mucous membranes are dry has not really eaten much today.  We will give some fluid and order her home meds including the antibiotic for her C. difficile.  Ultimately I am not surprised that patient is back because she was really unable  to walk unassisted in the hospital and needs SNF.  I have discussed with the hospitalist who agrees to admit the patient.   Clinical Course as of 05/22/22 2339  Tue May 22, 2022  1520 ED EKG [KM]    Clinical Course User Index [KM] Georga Hacking, MD     FINAL CLINICAL IMPRESSION(S) / ED DIAGNOSES   Final diagnoses:  Weakness     Rx / DC Orders   ED Discharge Orders     None        Note:  This document was prepared using Dragon voice recognition software and may include unintentional dictation errors.   Georga Hacking, MD 05/22/22 [phone]

## 2022-05-22 NOTE — ED Triage Notes (Signed)
See first nurse note. 

## 2022-05-23 ENCOUNTER — Encounter: Payer: Self-pay | Admitting: Internal Medicine

## 2022-05-23 DIAGNOSIS — E1165 Type 2 diabetes mellitus with hyperglycemia: Secondary | ICD-10-CM | POA: Diagnosis present

## 2022-05-23 DIAGNOSIS — Z7984 Long term (current) use of oral hypoglycemic drugs: Secondary | ICD-10-CM | POA: Diagnosis not present

## 2022-05-23 DIAGNOSIS — R1310 Dysphagia, unspecified: Secondary | ICD-10-CM | POA: Diagnosis present

## 2022-05-23 DIAGNOSIS — E039 Hypothyroidism, unspecified: Secondary | ICD-10-CM | POA: Diagnosis present

## 2022-05-23 DIAGNOSIS — G8929 Other chronic pain: Secondary | ICD-10-CM | POA: Diagnosis present

## 2022-05-23 DIAGNOSIS — S71101A Unspecified open wound, right thigh, initial encounter: Secondary | ICD-10-CM | POA: Diagnosis present

## 2022-05-23 DIAGNOSIS — R5381 Other malaise: Secondary | ICD-10-CM

## 2022-05-23 DIAGNOSIS — E785 Hyperlipidemia, unspecified: Secondary | ICD-10-CM | POA: Diagnosis present

## 2022-05-23 DIAGNOSIS — K5900 Constipation, unspecified: Secondary | ICD-10-CM | POA: Diagnosis present

## 2022-05-23 DIAGNOSIS — I10 Essential (primary) hypertension: Secondary | ICD-10-CM | POA: Diagnosis present

## 2022-05-23 DIAGNOSIS — Z794 Long term (current) use of insulin: Secondary | ICD-10-CM | POA: Diagnosis not present

## 2022-05-23 DIAGNOSIS — F419 Anxiety disorder, unspecified: Secondary | ICD-10-CM | POA: Diagnosis present

## 2022-05-23 DIAGNOSIS — Z90710 Acquired absence of both cervix and uterus: Secondary | ICD-10-CM | POA: Diagnosis not present

## 2022-05-23 DIAGNOSIS — I959 Hypotension, unspecified: Secondary | ICD-10-CM | POA: Diagnosis present

## 2022-05-23 DIAGNOSIS — M549 Dorsalgia, unspecified: Secondary | ICD-10-CM | POA: Diagnosis present

## 2022-05-23 DIAGNOSIS — X58XXXA Exposure to other specified factors, initial encounter: Secondary | ICD-10-CM | POA: Diagnosis present

## 2022-05-23 DIAGNOSIS — Z79890 Hormone replacement therapy (postmenopausal): Secondary | ICD-10-CM | POA: Diagnosis not present

## 2022-05-23 DIAGNOSIS — D649 Anemia, unspecified: Secondary | ICD-10-CM | POA: Diagnosis present

## 2022-05-23 DIAGNOSIS — K219 Gastro-esophageal reflux disease without esophagitis: Secondary | ICD-10-CM | POA: Diagnosis present

## 2022-05-23 DIAGNOSIS — F329 Major depressive disorder, single episode, unspecified: Secondary | ICD-10-CM | POA: Diagnosis present

## 2022-05-23 DIAGNOSIS — Z66 Do not resuscitate: Secondary | ICD-10-CM | POA: Diagnosis present

## 2022-05-23 DIAGNOSIS — R531 Weakness: Secondary | ICD-10-CM | POA: Diagnosis present

## 2022-05-23 DIAGNOSIS — A0472 Enterocolitis due to Clostridium difficile, not specified as recurrent: Secondary | ICD-10-CM

## 2022-05-23 DIAGNOSIS — E1140 Type 2 diabetes mellitus with diabetic neuropathy, unspecified: Secondary | ICD-10-CM | POA: Diagnosis present

## 2022-05-23 DIAGNOSIS — Z20822 Contact with and (suspected) exposure to covid-19: Secondary | ICD-10-CM | POA: Diagnosis present

## 2022-05-23 LAB — COMPREHENSIVE METABOLIC PANEL
ALT: 16 U/L (ref 0–44)
AST: 18 U/L (ref 15–41)
Albumin: 2.5 g/dL — ABNORMAL LOW (ref 3.5–5.0)
Alkaline Phosphatase: 95 U/L (ref 38–126)
Anion gap: 5 (ref 5–15)
BUN: 14 mg/dL (ref 8–23)
CO2: 27 mmol/L (ref 22–32)
Calcium: 8.6 mg/dL — ABNORMAL LOW (ref 8.9–10.3)
Chloride: 105 mmol/L (ref 98–111)
Creatinine, Ser: 0.63 mg/dL (ref 0.44–1.00)
GFR, Estimated: >60 mL/min (ref >60)
Glucose, Bld: 144 mg/dL — ABNORMAL HIGH (ref 70–99)
Potassium: 4 mmol/L (ref 3.5–5.1)
Sodium: 137 mmol/L (ref 135–145)
Total Bilirubin: 0.4 mg/dL (ref 0.3–1.2)
Total Protein: 5.6 g/dL — ABNORMAL LOW (ref 6.5–8.1)

## 2022-05-23 LAB — URINALYSIS, COMPLETE (UACMP) WITH MICROSCOPIC
Bilirubin Urine: NEGATIVE
Glucose, UA: NEGATIVE mg/dL
Ketones, ur: NEGATIVE mg/dL
Nitrite: POSITIVE — AB
Protein, ur: NEGATIVE mg/dL
Specific Gravity, Urine: 1.012 (ref 1.005–1.030)
pH: 5 (ref 5.0–8.0)

## 2022-05-23 LAB — GASTROINTESTINAL PANEL BY PCR, STOOL (REPLACES STOOL CULTURE)

## 2022-05-23 LAB — GLUCOSE, CAPILLARY
Glucose-Capillary: 102 mg/dL — ABNORMAL HIGH (ref 70–99)
Glucose-Capillary: 221 mg/dL — ABNORMAL HIGH (ref 70–99)
Glucose-Capillary: 152 mg/dL — ABNORMAL HIGH (ref 70–99)
Glucose-Capillary: 246 mg/dL — ABNORMAL HIGH (ref 70–99)

## 2022-05-23 LAB — CBC
HCT: 32.7 % — ABNORMAL LOW (ref 36.0–46.0)
Hemoglobin: 10.3 g/dL — ABNORMAL LOW (ref 12.0–15.0)
MCH: 28.3 pg (ref 26.0–34.0)
MCHC: 31.5 g/dL (ref 30.0–36.0)
MCV: 89.8 fL (ref 80.0–100.0)
Platelets: 356 10*3/uL (ref 150–400)
RBC: 3.64 MIL/uL — ABNORMAL LOW (ref 3.87–5.11)
RDW: 15.5 % (ref 11.5–15.5)
WBC: 11.1 10*3/uL — ABNORMAL HIGH (ref 4.0–10.5)
nRBC: 0 % (ref 0.0–0.2)

## 2022-05-23 LAB — PROCALCITONIN: Procalcitonin: <0.1 ng/mL

## 2022-05-23 LAB — C DIFFICILE QUICK SCREEN W PCR REFLEX
C Diff antigen: NEGATIVE
C Diff interpretation: NOT DETECTED
C Diff toxin: NEGATIVE

## 2022-05-23 LAB — PHOSPHORUS: Phosphorus: 3.1 mg/dL (ref 2.5–4.6)

## 2022-05-23 LAB — MAGNESIUM: Magnesium: 1.6 mg/dL — ABNORMAL LOW (ref 1.7–2.4)

## 2022-05-23 MED ORDER — PROSOURCE PLUS PO LIQD
30.0000 mL | Freq: Three times a day (TID) | ORAL | Status: DC
  Administered 2022-05-23 – 2022-05-26 (×9): 30 mL via ORAL
  Filled 2022-05-23 (×11): qty 30

## 2022-05-23 MED ORDER — BOOST / RESOURCE BREEZE PO LIQD CUSTOM
1.0000 | Freq: Three times a day (TID) | ORAL | Status: DC
  Administered 2022-05-23 – 2022-05-26 (×8): 1 via ORAL

## 2022-05-23 MED ORDER — MAGNESIUM SULFATE 4 GM/100ML IV SOLN
4.0000 g | Freq: Once | INTRAVENOUS | Status: AC
  Administered 2022-05-23: 4 g via INTRAVENOUS
  Filled 2022-05-23: qty 100

## 2022-05-23 MED ORDER — ALBUTEROL SULFATE (2.5 MG/3ML) 0.083% IN NEBU: 2.5000 mg | INHALATION_SOLUTION | RESPIRATORY_TRACT | Status: DC | PRN

## 2022-05-23 MED ORDER — HEPARIN SODIUM (PORCINE) 5000 UNIT/ML IJ SOLN
5000.0000 [IU] | Freq: Three times a day (TID) | INTRAMUSCULAR | Status: DC
  Administered 2022-05-23 – 2022-05-26 (×11): 5000 [IU] via SUBCUTANEOUS
  Filled 2022-05-23 (×11): qty 1

## 2022-05-23 MED ORDER — ALUM & MAG HYDROXIDE-SIMETH 200-200-20 MG/5ML PO SUSP
15.0000 mL | ORAL | Status: DC | PRN
  Administered 2022-05-23 – 2022-05-24 (×2): 15 mL via ORAL
  Filled 2022-05-23 (×2): qty 30

## 2022-05-23 MED ORDER — ONDANSETRON HCL 4 MG PO TABS: 4.0000 mg | ORAL_TABLET | Freq: Four times a day (QID) | ORAL | Status: DC | PRN

## 2022-05-23 MED ORDER — INSULIN ASPART 100 UNIT/ML IJ SOLN
0.0000 [IU] | Freq: Three times a day (TID) | INTRAMUSCULAR | Status: DC
  Administered 2022-05-23: 5 [IU] via SUBCUTANEOUS
  Administered 2022-05-23: 3 [IU] via SUBCUTANEOUS
  Administered 2022-05-24 (×2): 8 [IU] via SUBCUTANEOUS
  Administered 2022-05-25: 5 [IU] via SUBCUTANEOUS
  Administered 2022-05-25 (×2): 3 [IU] via SUBCUTANEOUS
  Administered 2022-05-26: 5 [IU] via SUBCUTANEOUS
  Filled 2022-05-23 (×8): qty 1

## 2022-05-23 MED ORDER — ONDANSETRON HCL 4 MG/2ML IJ SOLN: 4.0000 mg | Freq: Four times a day (QID) | INTRAMUSCULAR | Status: DC | PRN

## 2022-05-23 MED ORDER — SODIUM CHLORIDE 0.9 % IV SOLN: INTRAVENOUS | Status: AC

## 2022-05-23 MED ORDER — INSULIN DETEMIR 100 UNIT/ML ~~LOC~~ SOLN
10.0000 [IU] | Freq: Every day | SUBCUTANEOUS | Status: DC
  Administered 2022-05-23: 10 [IU] via SUBCUTANEOUS
  Filled 2022-05-23 (×2): qty 0.1

## 2022-05-23 MED ORDER — FOLIC ACID 1 MG PO TABS
1.0000 mg | ORAL_TABLET | Freq: Every day | ORAL | Status: DC
  Administered 2022-05-23 – 2022-05-26 (×4): 1 mg via ORAL
  Filled 2022-05-23 (×4): qty 1

## 2022-05-23 NOTE — Assessment & Plan Note (Addendum)
HbA1c is 8.5. The patient's glucoses are controlled with Levemir 10 units sub Q daily and FSBS and SSI.   Glucoses in the past 24 hours have run from 62-224. The patient will be discharged to home on glipizide as prior to admission.

## 2022-05-23 NOTE — Consult Note (Addendum)
NAME: Lisa Davis  DOB: 1936-02-04  MRN: [id number]  Date/Time: 05/23/2022 12:17 PM  REQUESTING PROVIDER: Gerri Lins Subjective:  REASON FOR CONSULT: Recurrent diarrhea due to cdiff ?Hisotry from aptient and daughter and chart reviewed Lisa Davis is a 86 y.o. female with a history of DM, HTN Hypothyroidism, GAD, GERD, closed compresison # L1, Rt hip hemiarthroplasty Presents to the ED on 05/22/22 with weakness, and family not able to lift her up from chair She laso had some loose stools but that was not the priimary reason EMS was called  She has a h/o fecal incontinence of 3 years, constipation with alternating diarrhea. She was also c/o dysphagia.  Saw GI on 05/04/22 for above symptoms and they wer ein the process of investigating her symptoms including a home SIBO test, they stopped metamucil and switched to citracel and were planning modified barium swallow studies On 05/02/22 patient was given keflex by urology for dysuria and abdominal pain and she says diarrhea got worse after that She presented to the ED on 7/30 for diarrhea, weakness Stool sent for cdiff and came back positive for antigen, negative for toxin. Placed on vanco as OP- came to the ED again on 8/9 with generalized weakness and inability to ambulate. She was hospitalized and electrolyte imbalance was corrected, hypotension was addressed with IV fluids. Vanco was changed to dificid because of persistent diarrhea. PT assessed her and recommended SNF but daughter decided to take her home on 05/21/22  Pt came back to the ED on 05/22/22  because she is unable to walk and need three people to assist her-   05/22/22  BP 134/74  Temp 98.2 F (36.8 C)  Pulse Rate 83  Resp 20  SpO2 97 %     Latest Reference Range & Units 05/22/22  WBC 4.0 - 10.5 K/uL 12.0 (H)  Hemoglobin 12.0 - 15.0 g/dL 67.6 (L)  HCT 72.0 - 94.7 % 38.1  Platelets 150 - 400 K/uL 415 (H)  Creatinine 0.44 - 1.00 mg/dL 0.96   Na 283, K 4.4, Glucose 324 Alb  2.8  PMH HTN DM dyslipidemia Closed colle's fracture MCI Dyslipidemia Urinary retention once  PSH Rt hip hemiarthroplasty Hysterectomy Cholecystectomy Lipoma removal Social History   Socioeconomic History   Marital status: Widowed    Spouse name: Not on file   Number of children: Not on file   Years of education: Not on file   Highest education level: Not on file  Occupational History   Not on file  Tobacco Use   Smoking status: Never   Smokeless tobacco: Never  Substance and Sexual Activity   Alcohol use: Never   Drug use: Never   Sexual activity: Not Currently  Other Topics Concern   Not on file  Social History Narrative   Not on file   Social Determinants of Health   Financial Resource Strain: Not on file  Food Insecurity: Not on file  Transportation Needs: Not on file  Physical Activity: Not on file  Stress: Not on file  Social Connections: Not on file  Intimate Partner Violence: Not on file    History reviewed. No pertinent family history. Allergies  Allergen Reactions   Chlorhexidine Rash   Chlorhexidine Gluconate Hives and Rash   Meperidine Nausea And Vomiting and Palpitations    Tachycardia Tachycardia Tachycardia    Statins Other (See Comments)    Intol to several   Meperidine Hcl     Tachycardia   Codeine Nausea And Vomiting   Metformin And  Related Diarrhea   Morphine And Related Nausea And Vomiting   Pedi-Pre Tape Spray [Wound Dressing Adhesive] Rash   Povidone Iodine Rash   I? Current Facility-Administered Medications  Medication Dose Route Frequency Provider Last Rate Last Admin   acetaminophen (TYLENOL) tablet 325 mg  325 mg Oral Q4H PRN Rada Hay, MD       albuterol (PROVENTIL) (2.5 MG/3ML) 0.083% nebulizer solution 2.5 mg  2.5 mg Nebulization Q2H PRN Clance Boll, MD       amitriptyline (ELAVIL) tablet 50 mg  50 mg Oral QHS Lorna Dibble, RPH   50 mg at 05/22/22 2116   calcium-vitamin D (OSCAL WITH D) 500-5  MG-MCG per tablet 1 tablet  1 tablet Oral Daily Rada Hay, MD   1 tablet at 05/23/22 [id number]   clorazepate (TRANXENE) tablet 3.75 mg  3.75 mg Oral BID Rada Hay, MD   3.75 mg at 05/23/22 [id number]   fidaxomicin (DIFICID) tablet 200 mg  200 mg Oral BID Rada Hay, MD   200 mg at 05/23/22 0937   fluticasone (FLONASE) 50 MCG/ACT nasal spray 1 spray  1 spray Each Nare Daily Rada Hay, MD       folic acid (FOLVITE) tablet 1 mg  1 mg Oral Daily Myles Rosenthal A, MD   1 mg at 05/23/22 0936   heparin injection 5,000 Units  5,000 Units Subcutaneous Q8H Myles Rosenthal A, MD   5,000 Units at 05/23/22 0615   insulin aspart (novoLOG) injection 0-15 Units  0-15 Units Subcutaneous TID WC Myles Rosenthal A, MD       insulin detemir (LEVEMIR) injection 10 Units  10 Units Subcutaneous QHS Myles Rosenthal A, MD       iron polysaccharides (NIFEREX) capsule 150 mg  150 mg Oral Daily Rada Hay, MD   150 mg at 05/23/22 0936   latanoprost (XALATAN) 0.005 % ophthalmic solution 1 drop  1 drop Both Eyes QHS Rada Hay, MD   1 drop at 05/22/22 2116   levothyroxine (SYNTHROID) tablet 75 mcg  75 mcg Oral QAC breakfast Rada Hay, MD   75 mcg at 05/23/22 [id number]   losartan (COZAAR) tablet 25 mg  25 mg Oral Daily Rada Hay, MD   25 mg at 05/23/22 [id number]   multivitamin with minerals tablet 1 tablet  1 tablet Oral Daily Rada Hay, MD   1 tablet at 05/23/22 0936   ondansetron (ZOFRAN) tablet 4 mg  4 mg Oral Q6H PRN Clance Boll, MD       Or   ondansetron [facility]) injection 4 mg  4 mg Intravenous Q6H PRN Clance Boll, MD       perphenazine (TRILAFON) tablet 4 mg  4 mg Oral QHS Beers, Shanon Brow, RPH   4 mg at 05/22/22 2116   propranolol (INDERAL) tablet 20 mg  20 mg Oral BID Rada Hay, MD   20 mg at 05/23/22 0955   sertraline (ZOLOFT) tablet 150 mg  150 mg Oral Daily Rada Hay, MD   150 mg at 05/23/22 0936   traMADol (ULTRAM)  tablet 50 mg  50 mg Oral Q12H Rada Hay, MD   50 mg at 05/23/22 0616   zolpidem (AMBIEN) tablet 5 mg  5 mg Oral QHS Rada Hay, MD   5 mg at 05/22/22 2116     Abtx:  Anti-infectives (From admission, onward)    Start     Dose/Rate  Route Frequency Ordered Stop   05/22/22 2200  fidaxomicin (DIFICID) tablet 200 mg        200 mg Oral 2 times daily 05/22/22 1526         REVIEW OF SYSTEMS:  Const: negative fever, negative chills, negative weight loss Eyes: negative diplopia or visual changes, negative eye pain ENT: negative coryza, negative sore throat Resp: negative cough, hemoptysis, dyspnea Cards: negative for chest pain, palpitations, lower extremity edema GU: negative for frequency, dysuria and hematuria GI: has fecal incontinence, now diarrhea Skin: negative for rash and pruritus Heme: negative for easy bruising and gum/nose bleeding MS: weakness Neurolo:negative for headaches, dizziness, vertigo, memory problems  Psych: anxiety, depression  Endocrine:  thyroid, diabetes Allergy/Immunology- negative for any medication or food allergies ? Pertinent Positives include : Objective:  VITALS:  BP (!) 123/59   Pulse 85   Temp 97.9 F (36.6 C)   Resp 15   Wt 73 kg   SpO2 95%   BMI 28.51 kg/m   PHYSICAL EXAM:  General: Alert, cooperative, no distress, appears stated age.  Head: Normocephalic, without obvious abnormality, atraumatic. Eyes: Conjunctivae clear, anicteric sclerae. Pupils are equal ENT Nares normal. No drainage or sinus tenderness. Lips, mucosa, and tongue normal. No Thrush Neck: Supple, symmetrical, no adenopathy, thyroid: non tender no carotid bruit and no JVD. Back: No CVA tenderness. Lungs: Clear to auscultation bilaterally. No Wheezing or Rhonchi. No rales. Heart: Regular rate and rhythm, no murmur, rub or gallop. Abdomen: Soft, non-tender,not distended. Bowel sounds normal. No masses Extremities:rt hip outer area small wound close to the  previous surgical scar   Skin: No rashes or lesions. Or bruising Lymph: Cervical, supraclavicular normal. Neurologic: Grossly non-focal Pertinent Labs Lab Results CBC    Component Value Date/Time   WBC 11.1 (H) 05/23/2022 0147   RBC 3.64 (L) 05/23/2022 0147   HGB 10.3 (L) 05/23/2022 0147   HCT 32.7 (L) 05/23/2022 0147   PLT 356 05/23/2022 0147   MCV 89.8 05/23/2022 0147   MCH 28.3 05/23/2022 0147   MCHC 31.5 05/23/2022 0147   RDW 15.5 05/23/2022 0147   LYMPHSABS 1.1 05/16/2022 1202   MONOABS 0.4 05/16/2022 1202   EOSABS 0.3 05/16/2022 1202   BASOSABS 0.1 05/16/2022 1202       Latest Ref Rng & Units 05/23/2022    1:47 AM 05/22/2022   12:18 PM 05/21/2022    4:29 AM  CMP  Glucose 70 - 99 mg/dL 151  761    BUN 8 - 23 mg/dL 14  19    Creatinine 6.07 - 1.00 mg/dL 3.71  0.62    Sodium 694 - 145 mmol/L 137  134    Potassium 3.5 - 5.1 mmol/L 4.0  4.4  4.5   Chloride 98 - 111 mmol/L 105  99    CO2 22 - 32 mmol/L 27  27    Calcium 8.9 - 10.3 mg/dL 8.6  9.0    Total Protein 6.5 - 8.1 g/dL 5.6  6.4    Total Bilirubin 0.3 - 1.2 mg/dL 0.4  0.5    Alkaline Phos 38 - 126 U/L 95  114    AST 15 - 41 U/L 18  23    ALT 0 - 44 U/L 16  19        Microbiology: Recent Results (from the past 240 hour(s))  Resp Panel by RT-PCR (Flu A&B, Covid) Anterior Nasal Swab     Status: None   Collection Time: 05/22/22  4:26 PM  Specimen: Anterior Nasal Swab  Result Value Ref Range Status   SARS Coronavirus 2 by RT PCR NEGATIVE NEGATIVE Final    Comment: (NOTE) SARS-CoV-2 target nucleic acids are NOT DETECTED.  The SARS-CoV-2 RNA is generally detectable in upper respiratory specimens during the acute phase of infection. The lowest concentration of SARS-CoV-2 viral copies this assay can detect is 138 copies/mL. A negative result does not preclude SARS-Cov-2 infection and should not be used as the sole basis for treatment or other patient management decisions. A negative result may occur with   improper specimen collection/handling, submission of specimen other than nasopharyngeal swab, presence of viral mutation(s) within the areas targeted by this assay, and inadequate number of viral copies(<138 copies/mL). A negative result must be combined with clinical observations, patient history, and epidemiological information. The expected result is Negative.  Fact Sheet for Patients:  EntrepreneurPulse.com.au  Fact Sheet for Healthcare Providers:  IncredibleEmployment.be  This test is no t yet approved or cleared by the [place] FDA and  has been authorized for detection and/or diagnosis of SARS-CoV-2 by FDA under an Emergency Use Authorization (EUA). This EUA will remain  in effect (meaning this test can be used) for the duration of the COVID-19 declaration under Section 564(b)(1) of the Act, 21 U.S.C.section 360bbb-3(b)(1), unless the authorization is terminated  or revoked sooner.       Influenza A by PCR NEGATIVE NEGATIVE Final   Influenza B by PCR NEGATIVE NEGATIVE Final    Comment: (NOTE) The Xpert Xpress SARS-CoV-2/FLU/RSV plus assay is intended as an aid in the diagnosis of influenza from Nasopharyngeal swab specimens and should not be used as a sole basis for treatment. Nasal washings and aspirates are unacceptable for Xpert Xpress SARS-CoV-2/FLU/RSV testing.  Fact Sheet for Patients: EntrepreneurPulse.com.au  Fact Sheet for Healthcare Providers: IncredibleEmployment.be  This test is not yet approved or cleared by the [place] FDA and has been authorized for detection and/or diagnosis of SARS-CoV-2 by FDA under an Emergency Use Authorization (EUA). This EUA will remain in effect (meaning this test can be used) for the duration of the COVID-19 declaration under Section 564(b)(1) of the Act, 21 U.S.C. section 360bbb-3(b)(1), unless the authorization is terminated  or revoked.  Performed at [facility], [address], [place], [place] [address]     IMAGING RESULTS: None to review ? Impression/Recommendation 62 yr with recurrent hospitalization in the past 3 weeks Clostridium difficile Diarrhea with underlying fecal incontinence and baseline constipation alternating with diarrhea Admitted with weakness  Cdiff diarrhea- pt has been on appropriate treatment since 7/31 Not sure whether her underlying GI issues is now contributing to this Also Diabetes mellitus- not well controlled- could there be an element of autonomic neuropathy? Continue fidoxamicin X 10 days Will get Stool for cdiff and GI panel Avoid antibiotics May need FMT  Weakness- check TSH, cortisol  Anemia  ?h/o fall compression fracture L1  Wound rt upper thigh lateral aspect close to the surgical scar- does not look overtly infected- could be a sinus tract- may do a Xray femur but just observe- ? ___________________________________________________ Discussed with patient, and her daughter in detail Note:  This document was prepared using Dragon voice recognition software and may include unintentional dictation errors.

## 2022-05-23 NOTE — Assessment & Plan Note (Signed)
The patient is receiving a H2 blocker. PPI is held due to C Diff colitis.

## 2022-05-23 NOTE — Assessment & Plan Note (Addendum)
The patient had been discharged to home on 6 days of dificid. This has been continued as inpatient. The patient stated that she had had one loose BM this morning. Dr. Rivka Safer has been consulted to help determine if this is indeed continued C Diff colitis, recurrent C Diff, or simply ongoing diarrhea.RStool was sent for repeat C Diff testing which was negative. ID has recommended completing 10 days of Dificid. It is possible that the patient may require FMT.  The patient does have a history of intermittent constipation/diarrhea as well as incontinence.  The patient states that she has had no further diarrhea.

## 2022-05-23 NOTE — Plan of Care (Signed)

## 2022-05-23 NOTE — Evaluation (Addendum)
Occupational Therapy Evaluation Patient Details Name: Lisa Davis MRN: [id number] DOB: 1935-12-03 Today's Date: 05/23/2022   History of Present Illness Lisa Davis is a 86 y/o female who presented to ER secondary to persistent weakness, ongoing diarrhea and inability to manage care in home environment; admitted for management of acute/chronic debility due to persistent CDiff infection.   Clinical Impression   Patient seen for OT evaluation, daughter present in room. Patient presenting with decreased strength, decreased endurance, impaired balance, and low vision impacting safety and independence in ADLs. At baseline, pt was receiving physical assistance from daughter for ADLs, IADLs, and functional mobility. Pt currently requiring set up A for grooming tasks at EOB, Min A for bed mobility, and Min guard for static sitting balance. Pt deferring further OOB mobility 2/2 fatigue. Pt will benefit from acute OT to increase overall independence in the areas of ADLs and functional mobility in order to safely discharge home. Pt and daughter endorsing they do not want HH OT because they have everything they need already set up at home and the pt's daughter is able to assist with all ADLs/IADLs. Family is able to provide 24/7 supervision/assistance at discharge.      Recommendations for follow up therapy are one component of a multi-disciplinary discharge planning process, led by the attending physician.  Recommendations may be updated based on patient status, additional functional criteria and insurance authorization.   Follow Up Recommendations  No OT follow up    Assistance Recommended at Discharge Frequent or constant Supervision/Assistance  Patient can return home with the following Assistance with cooking/housework;Assist for transportation;Help with stairs or ramp for entrance;Direct supervision/assist for medications management;Direct supervision/assist for financial management;A little  help with bathing/dressing/bathroom;A little help with walking and/or transfers    Functional Status Assessment  Patient has had a recent decline in their functional status and demonstrates the ability to make significant improvements in function in a reasonable and predictable amount of time.  Equipment Recommendations  None recommended by OT    Recommendations for Other Services       Precautions / Restrictions Precautions Precautions: Fall Restrictions Weight Bearing Restrictions: No      Mobility Bed Mobility Overal bed mobility: Needs Assistance Bed Mobility: Supine to Sit, Sit to Supine Rolling: Min assist   Supine to sit: Min assist Sit to supine: Supervision   General bed mobility comments: Min A for scooting hips towards EOB    Transfers                   General transfer comment: NT, pt deferring further OOB mobility      Balance Overall balance assessment: Needs assistance Sitting-balance support: No upper extremity supported, Feet supported Sitting balance-Leahy Scale: Fair         Standing balance comment: NT, pt deferring further OOB mobility                           ADL either performed or assessed with clinical judgement   ADL Overall ADL's : Needs assistance/impaired     Grooming: Wash/dry face;Sitting;Supervision/safety;Set up Grooming Details (indicate cue type and reason): Pt completed grooming tasks while sitting EOB with set up A and supervision for safety                                     Vision Baseline Vision/History: 2 Legally blind Ability to  See in Adequate Light: 2 Moderately impaired Patient Visual Report: No change from baseline Additional Comments: Wears glasses 24/7. Can only see shadows, however, does well navigating familiar home environment.     Perception     Praxis      Pertinent Vitals/Pain Pain Assessment Pain Assessment: No/denies pain     Hand Dominance      Extremity/Trunk Assessment Upper Extremity Assessment Upper Extremity Assessment: Generalized weakness   Lower Extremity Assessment Lower Extremity Assessment: Generalized weakness       Communication Communication Communication: No difficulties   Cognition Arousal/Alertness: Awake/alert Behavior During Therapy: WFL for tasks assessed/performed Overall Cognitive Status: History of cognitive impairments - at baseline                                 General Comments: Oriented to self/location/time, grossly to situation     General Comments       Exercises     Shoulder Instructions      Home Living Family/patient expects to be discharged to:: Private residence Living Arrangements: Children Available Help at Discharge: Family;Available 24 hours/day Type of Home: Apartment       Home Layout: One level     Bathroom Shower/Tub: Walk-in shower         Home Equipment: Agricultural consultant (2 wheels);BSC/3in1;Shower seat - built in   Additional Comments: Pt has handicap accessible bathroom. Daughter stays with pt everyday throughout the week and then rest of children take turns staying with pt on weekends.      Prior Functioning/Environment Prior Level of Function : Needs assist;History of Falls (last six months)       Physical Assist : ADLs (physical)   ADLs (physical): Grooming;Bathing;Dressing;Toileting;IADLs Mobility Comments: Basic transfers and household distance gait with RW, manual w/c in community, supervision/assist from daughter as needed (does endorse general decline in previous 6-8 months); endorses at least 2 falls in previous six months ADLs Comments: Daughter provides assistance with all ADLs (bathing, dressing, toileting) and IADLs (household management, cooking, cleaning, driving, medication management)        OT Problem List: Decreased strength;Decreased activity tolerance;Impaired balance (sitting and/or standing);Impaired  vision/perception      OT Treatment/Interventions: Self-care/ADL training;Therapeutic exercise;Energy conservation;DME and/or AE instruction;Therapeutic activities;Patient/family education;Balance training    OT Goals(Current goals can be found in the care plan section) Acute Rehab OT Goals Patient Stated Goal: to go home OT Goal Formulation: With patient/family Time For Goal Achievement: 06/06/22 Potential to Achieve Goals: Good ADL Goals Pt Will Perform Grooming: with min assist;sitting Pt Will Perform Upper Body Dressing: with min assist;sitting Pt Will Perform Lower Body Dressing: with min assist;sit to/from stand Pt Will Transfer to Toilet: with min assist;ambulating;bedside commode Pt Will Perform Toileting - Clothing Manipulation and hygiene: with min assist;sit to/from stand  OT Frequency: Min 2X/week    Co-evaluation              AM-PAC OT "6 Clicks" Daily Activity     Outcome Measure Help from another person eating meals?: A Little Help from another person taking care of personal grooming?: A Little Help from another person toileting, which includes using toliet, bedpan, or urinal?: A Lot Help from another person bathing (including washing, rinsing, drying)?: A Lot Help from another person to put on and taking off regular upper body clothing?: A Little Help from another person to put on and taking off regular lower body clothing?: A Little 6  Click Score: 16   End of Session Nurse Communication: Mobility status  Activity Tolerance: Patient tolerated treatment well;Patient limited by fatigue Patient left: in bed;with call bell/phone within reach;with bed alarm set;with nursing/sitter in room;with family/visitor present  OT Visit Diagnosis: Muscle weakness (generalized) (M62.81);History of falling (Z91.81);Low vision, both eyes (H54.2)                Time: 2563-8937 OT Time Calculation (min): 16 min Charges:  OT General Charges $OT Visit: 1 Visit OT  Evaluation $OT Eval Low Complexity: 1 Low  [facility] MS, OTR/L ascom [phone]  05/23/22, 4:46 PM

## 2022-05-23 NOTE — Assessment & Plan Note (Addendum)
The patient was discharged to home from this facility on 05/18/2022. At that time the patient was recommended to discharge to rehab by PT. Patient and family opted to go home. Initially the patient was able to transfer from wheelchair to bed, but soon the patient's diarrhea returned, and she was too weak to get up off of the toilet.   The patient will discharge to home with her family with PT/OT home health.

## 2022-05-23 NOTE — Progress Notes (Signed)
1943 No stool sample provided this shift, night nurse aware and will collect if pt has BM

## 2022-05-23 NOTE — Progress Notes (Signed)
PROGRESS NOTE  Lisa Davis OJJ:[id number] DOB: 1935-11-28 DOA: 05/22/2022 PCP: Vernona Rieger, MD  Brief History   Lisa Davis is a 86 y.o. female with medical history significant of  depression, anxiety, non-insulin-dependent diabetes mellitus, hypertension, neuropathy, hypothyroid, GERD,C-dif infection, who has interim history of recent admission  05/16/22-05/21/22 for generalized weakness and inability to ambulate on her own. Of note patient also has history diagnosis of C-dif colitis on 05/07/2022 for which she was treated with po vanc qid x 10 days with completion of course onf 05/18/2022. ON return to ED  on 8/9 patient was noted to have continued diarrhea and was switched to Dficid for 10 days with end dated of 05/27/22. Patient prior to discharge was evaluated by PT who recommended SNF however family declined placement and patient was discharged home. Patient now return less than 24 hours later with family noting that patient is 3 person assist and continues to have profuse diarrhea. They have noted that they are unable to provide the care for her at home, so she was brought back to ED.  Patient daughter at bedside who give history notes, patient has continued increase stools at home but no complaints of fever chills, no n/v/abd pain or sob.However notes severe generalize weakness .  The patient has been admitted with enteric precautions. She is still receiving dificid. ID has been consulted. Her magnesium has been supplemented.   PT/OT has evaluated the patient and has recommended that the patient discharge to home with home health PT/OT.  Consultants  Infectious disease  Procedures  None  Antibiotics   Anti-infectives (From admission, onward)    Start     Dose/Rate Route Frequency Ordered Stop   05/22/22 2200  fidaxomicin (DIFICID) tablet 200 mg        200 mg Oral 2 times daily 05/22/22 1526        Subjective  The patient is resting comfortably. She states that she has  had one bowel movement since admission. No new complaints.  Objective   Vitals:  Vitals:   05/23/22 0945 05/23/22 1536  BP: (!) 123/59 (!) 136/58  Pulse:  87  Resp:  15  Temp:  98.3 F (36.8 C)  SpO2:  99%    Exam:  Constitutional:  The patient is awake, alert, and oriented x 3. No acute distress. Respiratory:  No increased work of breathing. No wheezes, rales, or rhonchi No tactile fremitus Cardiovascular:  Regular rate and rhythm No murmurs, ectopy, or gallups. No lateral PMI. No thrills. Abdomen:  Abdomen is soft, non-tender, non-distended No hernias, masses, or organomegaly Normoactive bowel sounds.  Musculoskeletal:  No cyanosis, clubbing, or edema Skin:  No rashes, lesions, ulcers palpation of skin: no induration or nodules Neurologic:  CN 2-12 intact Sensation all 4 extremities intact Psychiatric:  Mental status Mood, affect appropriate Orientation to person, place, time  judgment and insight appear intact   I have personally reviewed the following:   Today's Data  Vitals  Lab Data  CBC CMP Magnesium  Micro Data    Imaging    Cardiology Data  EKG  Other Data    Scheduled Meds:  (feeding supplement) PROSource Plus  30 mL Oral TID BM   amitriptyline  50 mg Oral QHS   calcium-vitamin D  1 tablet Oral Daily   clorazepate  3.75 mg Oral BID   feeding supplement  1 Container Oral TID BM   fidaxomicin  200 mg Oral BID   fluticasone  1 spray Each Nare Daily  folic acid  1 mg Oral Daily   heparin  5,000 Units Subcutaneous Q8H   insulin aspart  0-15 Units Subcutaneous TID WC   insulin detemir  10 Units Subcutaneous QHS   iron polysaccharides  150 mg Oral Daily   latanoprost  1 drop Both Eyes QHS   levothyroxine  75 mcg Oral QAC breakfast   losartan  25 mg Oral Daily   multivitamin with minerals  1 tablet Oral Daily   perphenazine  4 mg Oral QHS   propranolol  20 mg Oral BID   sertraline  150 mg Oral Daily   traMADol  50 mg Oral Q12H    zolpidem  5 mg Oral QHS   Continuous Infusions:  Principal Problem:   Weakness   LOS: 0 days   A & P  Assessment and Plan: * Debility The patient was discharged to home from this facility on 05/18/2022. At that time the patient was recommended to discharge to rehab by PT. Patient and family opted to go home. Initially the patient was able to transfer from wheelchair to bed, but soon the patient's diarrhea returned, and she was too weak to get up off of the toilet.   Patient's family told me that they were open to rehab placement, but there is a note from case management that they wanted the patient to discharge to home with home health PT/OT.   C. difficile colitis The patient had been discharged to home on 6 days of dificid. This has been continued as inpatient. The patient stated that she had had one loose BM this morning. Dr. Rivka Safer has been consulted to help determine if this is indeed continued C Diff colitis, recurrent C Diff, or simply ongoing diarrhea. If she is not currently infected, it is possible that her diarrhea could be addressed with loparimide.   Hypomagnesemia Magnesium 1.6 this morning. Will supplement and monitor.  Acute on chronic urinary retention - monitor uop  -strict I/o  -monitor for signs of retention     Major depressive disorder Continue on Tranxene/Zoloft.  GERD (gastroesophageal reflux disease) The patient is receiving a H2 blocker. PPI is held due to C Diff colitis.  Uncontrolled type 2 diabetes mellitus with hyperglycemia, without long-term current use of insulin (HCC) HbA1c is 8.5. The patient's glucoses are controlled with Levemir 10 units sub Q daily and FSBS and SSI.   Hypertension The patient is normotensive on losartan and propranolol.  Hypothyroidism Continue synthroid 75 mcg daily as at home.    I have seen and examined this patient myself. I have spent 38 minutes in her evaluation and care.  DVT prophylaxis: Heparin Code  Status: DNR Family Communication: Daughters at bedside. All questions answered to the best of my ability. Disposition Plan: Home with home health PT/OT    Rondall Radigan, DO [facility] Direct contact: see [email or web address]  7PM-7AM contact night coverage as above 05/23/2022, 3:52 PM  LOS: 0 days

## 2022-05-23 NOTE — Assessment & Plan Note (Signed)
Continue synthroid 75 mcg daily as at home.

## 2022-05-23 NOTE — Assessment & Plan Note (Signed)
The patient is normotensive on losartan and propranolol.

## 2022-05-23 NOTE — Progress Notes (Addendum)
Inpatient Diabetes Program Recommendations  AACE/ADA: New Consensus Statement on Inpatient Glycemic Control (2015)  Target Ranges:  Prepandial:   less than 140 mg/dL      Peak postprandial:   less than 180 mg/dL (1-2 hours)      Critically ill patients:  140 - 180 mg/dL   Lab Results  Component Value Date   GLUCAP 102 (H) 05/23/2022   HGBA1C 8.5 (H) 05/17/2022    Review of Glycemic Control  Latest Reference Range & Units 05/22/22 12:49 05/22/22 17:25 05/23/22 07:45  Glucose-Capillary 70 - 99 mg/dL 737 (H) 106 (H) 269 (H)   Diabetes history: DM 2 Outpatient Diabetes medications:  Glucotrol XL 10 mg bid, Metformin 1000 mg bid Current orders for Inpatient glycemic control:  Novolog moderate (0-15) tid with meals Levemir 10 units q HS  Inpatient Diabetes Program Recommendations:         Patient/family refused Levemir last PM.  Consider d/c of Levemir for now and reduce Novolog correction to sensitive tid with meals.   Thanks,  Beryl Meager, RN, BC-ADM Inpatient Diabetes Coordinator Pager [phone]   (8a-5p)

## 2022-05-23 NOTE — Assessment & Plan Note (Addendum)
-   monitor uop  -strict I/o  No signs of urinary retention during this stay.

## 2022-05-23 NOTE — Assessment & Plan Note (Signed)
Continue on Tranxene/Zoloft.

## 2022-05-23 NOTE — Assessment & Plan Note (Signed)
Resolved. Monitor.

## 2022-05-23 NOTE — Evaluation (Signed)
Physical Therapy Evaluation Patient Details Name: Lisa Davis MRN: [id number] DOB: 1936-04-20 Today's Date: 05/23/2022  History of Present Illness  presented to ER secondary to persistent weakness, ongoing diarrhea and inability to manage care in home environment; admitted for management of acute/chronic debility due to persistent CDiff infection.  Clinical Impression  Patient resting in bed upon arrival to room; daughters, Marylu Lund and Larene Beach, at bedside.  Patient alert and oriented to self, location, general date and situation; follows commands and effectively communicates basic wants/needs throughout session.  Very pleasant and cooperative this session; eager for OOB to chair.  Endorses generalized soreness to bilat knees due to chronic arthritic changes, but denies acute/focal pain.  Bilat UE/LE strength and ROM grossly symmetrical and WFL; no focal weakness appreciated.  Able to complete bed mobility with min assist; sit/stand, basic transfers and gait (30') with RW, min assist.  Demonstrates short, shuffling steps; slower, deliberate cadence; min/mod assist for walker management (due to baseline visual deficits). No overt buckling or LOB, but does require consistent cuing/direction for continuation/completion of task.  Does notably fatigue after 30'; eager to sit as knees fatigue Did issue gait belt to patient/family and verbally reviewed/demonstrated use; will plan to integrate hands-on family training next session to optimize safety/indep with care needs in home environment.  Additionally, will consult OT for additional assessment of ADLs and self-care needs with family training as appropriate.] Would benefit from skilled PT to address above deficits and promote optimal return to PLOF.; Recommend transition to HHPT, OT, RN and aide upon discharge from acute hospitalization with 24/7 supervision/assist from family as needed (daughter willing and available).  Of note, daughters present to observe,  learn and encourage patient throughout session; appreciate of therapist input and education.        Recommendations for follow up therapy are one component of a multi-disciplinary discharge planning process, led by the attending physician.  Recommendations may be updated based on patient status, additional functional criteria and insurance authorization.  Follow Up Recommendations Home health PT Can patient physically be transported by private vehicle: Yes    Assistance Recommended at Discharge Frequent or constant Supervision/Assistance  Patient can return home with the following  A little help with walking and/or transfers;A little help with bathing/dressing/bathroom;Assistance with cooking/housework;Assist for transportation;Help with stairs or ramp for entrance    Equipment Recommendations  (has RW, BSC and manual WC)  Recommendations for Other Services       Functional Status Assessment Patient has had a recent decline in their functional status and demonstrates the ability to make significant improvements in function in a reasonable and predictable amount of time.     Precautions / Restrictions Precautions Precautions: Fall Restrictions Weight Bearing Restrictions: No      Mobility  Bed Mobility Overal bed mobility: Needs Assistance Bed Mobility: Supine to Sit     Supine to sit: Min guard, Min assist     General bed mobility comments: min assist for scooting towards edge of bed    Transfers Overall transfer level: Needs assistance Equipment used: Rolling walker (2 wheels) Transfers: Sit to/from Stand Sit to Stand: Min assist, Mod assist Stand pivot transfers: Min assist         General transfer comment: min/mod assist for lift off (cuing for transfer mechanics and hand placement); min assist for balance once upright    Ambulation/Gait Ambulation/Gait assistance: Min assist Gait Distance (Feet): 30 Feet Assistive device: Rolling walker (2 wheels)          General  Gait Details: short, shuffling steps; slower, deliberate cadence; min/mod assist for walker management (due to baseline visual deficits). No overt buckling or LOB, but does require consistent cuing/direction for continuation/completion of task.  Does notably fatigue after 30'; eager to sit as knees fatigue  Stairs            Wheelchair Mobility    Modified Rankin (Stroke Patients Only)       Balance Overall balance assessment: Needs assistance Sitting-balance support: No upper extremity supported, Feet supported Sitting balance-Leahy Scale: Fair     Standing balance support: Bilateral upper extremity supported Standing balance-Leahy Scale: Fair                               Pertinent Vitals/Pain Pain Assessment Pain Assessment: No/denies pain    Home Living Family/patient expects to be discharged to:: Private residence Living Arrangements: Children Available Help at Discharge: Family;Available 24 hours/day Type of Home: Apartment         Home Layout: One level Home Equipment: Agricultural consultant (2 wheels)      Prior Function Prior Level of Function : Needs assist             Mobility Comments: Basic transfers and household -distance gait with RW, supervision/assist from daughter as needed (does endorse general decline in previous 6-8 months); endorses at least 2 falls in previous six months ADLs Comments: Daughter assists with ADLs, iADLs as needed     Hand Dominance        Extremity/Trunk Assessment   Upper Extremity Assessment Upper Extremity Assessment: Generalized weakness    Lower Extremity Assessment Lower Extremity Assessment: Generalized weakness (grossly 4-/5 throughout; no focal weakness appreciated.  Generalized edema mid-calf distally.  Baseline neuropathy mid-calf distally.)       Communication      Cognition Arousal/Alertness: Awake/alert Behavior During Therapy: WFL for tasks assessed/performed Overall  Cognitive Status: History of cognitive impairments - at baseline                                 General Comments: Oriented to self, location, general situation; follows commands and participates well this session        General Comments      Exercises Other Exercises Other Exercises: Toilet transfer, SPT to/from [facility] with RW, min assist from therapist; sit/stand from [facility] with RW, min/mod assist; standing balance for hygiene, min assist with RW (patient performing own peri-care).  Removed purewick; encouraged OOB to [facility] for all toileting needs, CNA/RN informed/aware. Other Exercises: Multiple repetitions of sit/stand from various seating surfaces, min/mod assist with RW for lift off and standing balance. Other Exercises: Issued patient gait belt, educated family in use; will plan to incorporate some hands-on, family training next session as appropriate.   Assessment/Plan    PT Assessment Patient needs continued PT services  PT Problem List Decreased strength;Decreased range of motion;Decreased activity tolerance;Decreased balance;Decreased mobility;Decreased knowledge of use of DME;Decreased safety awareness;Cardiopulmonary status limiting activity       PT Treatment Interventions DME instruction;Gait training;Functional mobility training;Therapeutic activities;Therapeutic exercise;Balance training;Patient/family education    PT Goals (Current goals can be found in the Care Plan section)  Acute Rehab PT Goals Patient Stated Goal: get stronger, be able to go back home with family PT Goal Formulation: With patient/family Time For Goal Achievement: 06/06/22 Potential to Achieve Goals: Good    Frequency Min  2X/week     Co-evaluation               AM-PAC PT "6 Clicks" Mobility  Outcome Measure Help needed turning from your back to your side while in a flat bed without using bedrails?: A Little Help needed moving from lying on your back to sitting on the side of  a flat bed without using bedrails?: A Little Help needed moving to and from a bed to a chair (including a wheelchair)?: A Little Help needed standing up from a chair using your arms (e.g., wheelchair or bedside chair)?: A Little Help needed to walk in hospital room?: A Little Help needed climbing 3-5 steps with a railing? : A Lot 6 Click Score: 17    End of Session Equipment Utilized During Treatment: Gait belt Activity Tolerance: Patient tolerated treatment well Patient left: in chair;with chair alarm set;with family/visitor present Nurse Communication: Mobility status PT Visit Diagnosis: Muscle weakness (generalized) (M62.81);Difficulty in walking, not elsewhere classified (R26.2);Unsteadiness on feet (R26.81);History of falling (Z91.81)    Time: 8676-7209 PT Time Calculation (min) (ACUTE ONLY): 38 min   Charges:   PT Evaluation $PT Eval Moderate Complexity: 1 Mod PT Treatments $Gait Training: 8-22 mins $Therapeutic Activity: 8-22 mins       Fiora Weill H. Manson Passey, PT, DPT, NCS 05/23/22, 3:40 PM [phone]

## 2022-05-23 NOTE — Progress Notes (Signed)
PT Cancellation Note  Patient Details Name: Zakeria Kulzer MRN: [id number] DOB: 1936/03/21   Cancelled Treatment:    Reason Eval/Treat Not Completed:  (Consult received and chart reviewed.  Patient currently eating breakfast; daughters at bedside to assist.  Will re-attempt at later time once meal complete.)  Takoda Janowiak H. Manson Passey, PT, DPT, NCS 05/23/22, 9:36 AM [phone]

## 2022-05-23 NOTE — Progress Notes (Signed)
Initial Nutrition Assessment  DOCUMENTATION CODES:   Not applicable  INTERVENTION:   -30 ml Prosource Plus TID, each supplement provides 100 kcals and 15 grams protein -Boost Breeze po TID, each supplement provides 250 kcal and 9 grams of protein  -MVI with minerals daily -Downgrade diet to dysphagia 3 for ease of intake  NUTRITION DIAGNOSIS:   Inadequate oral intake related to decreased appetite as evidenced by per patient/family report.  GOAL:   Patient will meet greater than or equal to 90% of their needs  MONITOR:   PO intake, Supplement acceptance  REASON FOR ASSESSMENT:   Consult Assessment of nutrition requirement/status  ASSESSMENT:   Pt with medical history significant of  depression, anxiety, non-insulin-dependent diabetes mellitus, hypertension, neuropathy, hypothyroid, GERD,C-dif infection, who has been admitted for generalized weakness and inability to ambulate on her own  Pt admitted with acute on chronic debility and c-diff infection.   Reviewed I/O's: +450 ml x 24 hours  UOP: 550 ml x 24 hours   Pt working with physical therapy at time of visit.   Pt familiar to this RD from prior admission last week. Pt has been having decreased appetite secondary to c-diff infections. No meal completion data available to assist at this time.   Pt does not tolerate Ensure supplements well, so will order Boost Breeze.   Reviewed wt hx; pt wt has been stable over the past 4 months.   Medications reviewed and include calcium-vitamin D and folic acid.   Lab Results  Component Value Date   HGBA1C 8.5 (H) 05/17/2022   PTA DM medications are 1000 mg metformin BID. Per ADA's Standards of Medical Care of Diabetes, glycemic targets for older adults who have multiple co-morbidities, cognitive impairments, and functional dependence should be less stringent (Hgb A1c <8.0-8.5).    Labs reviewed: CBGS: 102-283 (inpatient orders for glycemic control are 0-15 units insulin  aspart and insulin detemir daily at bedtime).    NUTRITION - FOCUSED PHYSICAL EXAM:  Flowsheet Row Most Recent Value  Orbital Region No depletion  Upper Arm Region Mild depletion  Thoracic and Lumbar Region No depletion  Buccal Region No depletion  Temple Region Mild depletion  Clavicle Bone Region No depletion  Clavicle and Acromion Bone Region No depletion  Scapular Bone Region No depletion  Dorsal Hand No depletion  Patellar Region No depletion  Anterior Thigh Region No depletion  Posterior Calf Region No depletion  Edema (RD Assessment) None  Hair Reviewed  Eyes Reviewed  Mouth Reviewed  Skin Reviewed  Nails Reviewed       Diet Order:   Diet Order             Diet heart healthy/carb modified Room service appropriate? Yes; Fluid consistency: Thin  Diet effective now                   EDUCATION NEEDS:   No education needs have been identified at this time  Skin:  Skin Assessment: Skin Integrity Issues: Skin Integrity Issues:: Other (Comment) Other: non-pressure wound to rt thigh  Last BM:  05/23/22  Height:   Ht Readings from Last 1 Encounters:  05/16/22 5\' 3"  (1.6 m)    Weight:   Wt Readings from Last 1 Encounters:  05/22/22 73 kg    Ideal Body Weight:  52.3 kg  BMI:  Body mass index is 28.51 kg/m.  Estimated Nutritional Needs:   Kcal:  1550-1750  Protein:  80-95 grams  Fluid:  > 1.5 L  Loistine Chance, RD, LDN, [place] Registered Dietitian II Certified Diabetes Care and Education Specialist Please refer to [facility] for RD and/or RD on-call/weekend/after hours pager

## 2022-05-24 DIAGNOSIS — Z794 Long term (current) use of insulin: Secondary | ICD-10-CM | POA: Diagnosis not present

## 2022-05-24 DIAGNOSIS — A0472 Enterocolitis due to Clostridium difficile, not specified as recurrent: Secondary | ICD-10-CM | POA: Diagnosis not present

## 2022-05-24 DIAGNOSIS — E039 Hypothyroidism, unspecified: Secondary | ICD-10-CM | POA: Diagnosis not present

## 2022-05-24 DIAGNOSIS — E1165 Type 2 diabetes mellitus with hyperglycemia: Secondary | ICD-10-CM | POA: Diagnosis not present

## 2022-05-24 DIAGNOSIS — R5381 Other malaise: Secondary | ICD-10-CM | POA: Diagnosis not present

## 2022-05-24 LAB — MAGNESIUM: Magnesium: 2 mg/dL (ref 1.7–2.4)

## 2022-05-24 LAB — BASIC METABOLIC PANEL
Anion gap: 6 (ref 5–15)
BUN: 15 mg/dL (ref 8–23)
CO2: 29 mmol/L (ref 22–32)
Calcium: 8.3 mg/dL — ABNORMAL LOW (ref 8.9–10.3)
Chloride: 101 mmol/L (ref 98–111)
Creatinine, Ser: 0.71 mg/dL (ref 0.44–1.00)
GFR, Estimated: >60 mL/min (ref >60)
Glucose, Bld: 298 mg/dL — ABNORMAL HIGH (ref 70–99)
Potassium: 3.5 mmol/L (ref 3.5–5.1)
Sodium: 136 mmol/L (ref 135–145)

## 2022-05-24 LAB — CORTISOL-AM, BLOOD: Cortisol - AM: 5.7 ug/dL — ABNORMAL LOW (ref 6.7–22.6)

## 2022-05-24 LAB — GLUCOSE, CAPILLARY
Glucose-Capillary: 83 mg/dL (ref 70–99)
Glucose-Capillary: 298 mg/dL — ABNORMAL HIGH (ref 70–99)
Glucose-Capillary: 333 mg/dL — ABNORMAL HIGH (ref 70–99)
Glucose-Capillary: 277 mg/dL — ABNORMAL HIGH (ref 70–99)
Glucose-Capillary: 310 mg/dL — ABNORMAL HIGH (ref 70–99)
Glucose-Capillary: 127 mg/dL — ABNORMAL HIGH (ref 70–99)

## 2022-05-24 LAB — TSH: TSH: 5.082 u[IU]/mL — ABNORMAL HIGH (ref 0.350–4.500)

## 2022-05-24 LAB — T4, FREE: Free T4: 0.99 ng/dL (ref 0.61–1.12)

## 2022-05-24 MED ORDER — CALCIUM GLUCONATE-NACL 1-0.675 GM/50ML-% IV SOLN: 1.0000 g | Freq: Once | INTRAVENOUS | Status: DC

## 2022-05-24 MED ORDER — INSULIN DETEMIR 100 UNIT/ML ~~LOC~~ SOLN
17.0000 [IU] | Freq: Every day | SUBCUTANEOUS | Status: DC
  Administered 2022-05-25: 17 [IU] via SUBCUTANEOUS
  Filled 2022-05-24 (×3): qty 0.17

## 2022-05-24 NOTE — TOC Initial Note (Signed)
Transition of Care [facility]) - Initial/Assessment Note    Patient Details  Name: Lisa Davis MRN: [id number] Date of Birth: 1936/09/12  Transition of Care [facility]) CM/SW Contact:    Truddie Hidden, RN Phone Number: 05/24/2022, 1:28 PM  Clinical Narrative:                 Spoke with patient's daughter, Larene Beach. Patient daughter agreeable to SNF. She is requesting [facility]. Spoke with Verlon Au at [facility] to advised of incoming initial referral.         Patient Goals and CMS Choice        Expected Discharge Plan and Services                                                Prior Living Arrangements/Services                       Activities of Daily Living Home Assistive Devices/Equipment: Environmental consultant (specify type) ADL Screening (condition at time of admission) Patient's cognitive ability adequate to safely complete daily activities?: Yes Is the patient deaf or have difficulty hearing?: No Does the patient have difficulty seeing, even when wearing glasses/contacts?: Yes Does the patient have difficulty concentrating, remembering, or making decisions?: No Patient able to express need for assistance with ADLs?: Yes Does the patient have difficulty dressing or bathing?: Yes Independently performs ADLs?: No Communication: Independent Is this a change from baseline?: Pre-admission baseline Dressing (OT): Needs assistance Is this a change from baseline?: Pre-admission baseline Grooming: Needs assistance Is this a change from baseline?: Pre-admission baseline Feeding: Independent with device (comment) (Needs set up) Is this a change from baseline?: Pre-admission baseline Bathing: Needs assistance Is this a change from baseline?: Pre-admission baseline Toileting: Needs assistance Is this a change from baseline?: Pre-admission baseline In/Out Bed: Needs assistance Walks in Home: Needs assistance Does the patient have difficulty walking or climbing  stairs?: Yes Weakness of Legs: Both Weakness of Arms/Hands: Both  Permission Sought/Granted                  Emotional Assessment              Admission diagnosis:  Weakness [R53.1] Patient Active Problem List   Diagnosis Date Noted   Hypertension    Acute on chronic urinary retention 05/17/2022   Debility 05/16/2022   Uncontrolled type 2 diabetes mellitus with hyperglycemia, without long-term current use of insulin (HCC) 05/16/2022   Hypothyroidism 05/16/2022   GERD (gastroesophageal reflux disease) 05/16/2022   Generalized anxiety disorder 05/16/2022   Major depressive disorder 05/16/2022   Dyslipidemia 05/16/2022   C. difficile colitis 05/16/2022   Hypomagnesemia 05/16/2022   Legally blind 05/16/2022   Hypophosphatemia 05/16/2022   PCP:  Vernona Rieger, MD Pharmacy:   [id number] [place], [place] - [address] AT [facility] [address] [place] [address] Phone: [phone] Fax: [phone]     Social Determinants of Health (SDOH) Interventions    Readmission Risk Interventions     No data to display

## 2022-05-24 NOTE — Progress Notes (Signed)
Date of Admission:  05/22/2022     ID: Lisa Davis is a 86 y.o. female  Principal Problem:   Debility Active Problems:   Uncontrolled type 2 diabetes mellitus with hyperglycemia, without long-term current use of insulin (HCC)   Hypothyroidism   GERD (gastroesophageal reflux disease)   Major depressive disorder   C. difficile colitis   Hypomagnesemia   Acute on chronic urinary retention   Hypertension   Daughter at bed side  Subjective: Doing much better No diarrhea Worked with PT Cdiff test is negative  Medications:   (feeding supplement) PROSource Plus  30 mL Oral TID BM   amitriptyline  50 mg Oral QHS   calcium-vitamin D  1 tablet Oral Daily   clorazepate  3.75 mg Oral BID   feeding supplement  1 Container Oral TID BM   fidaxomicin  200 mg Oral BID   fluticasone  1 spray Each Nare Daily   folic acid  1 mg Oral Daily   heparin  5,000 Units Subcutaneous Q8H   insulin aspart  0-15 Units Subcutaneous TID WC   insulin detemir  10 Units Subcutaneous QHS   iron polysaccharides  150 mg Oral Daily   latanoprost  1 drop Both Eyes QHS   levothyroxine  75 mcg Oral QAC breakfast   losartan  25 mg Oral Daily   multivitamin with minerals  1 tablet Oral Daily   perphenazine  4 mg Oral QHS   propranolol  20 mg Oral BID   sertraline  150 mg Oral Daily   traMADol  50 mg Oral Q12H   zolpidem  5 mg Oral QHS    Objective: Vital signs in last 24 hours: Temp:  [97 F (36.1 C)-98.5 F (36.9 C)] 98.1 F (36.7 C) (08/17 1542) Pulse Rate:  [78-83] 79 (08/17 1542) Resp:  [18-20] 18 (08/17 1542) BP: (104-126)/(41-76) 104/41 (08/17 1542) SpO2:  [93 %-100 %] 93 % (08/17 1542)    PHYSICAL EXAM:  General: Alert, cooperative, no distress, appears stated age.  Eyes: legally blind Lungs: Clear to auscultation bilaterally. No Wheezing or Rhonchi. No rales. Heart: Regular rate and rhythm, no murmur, rub or gallop. Abdomen: Soft, non-tender,not distended. Bowel sounds normal. No  masses Extremities: some edema legs With brising Skin: No rashes or lesions. Or bruising Lymph: Cervical, supraclavicular normal. Neurologic: Grossly non-focal  Lab Results Recent Labs    05/22/22 1218 05/23/22 0147 05/24/22 1403  WBC 12.0* 11.1*  --   HGB 11.8* 10.3*  --   HCT 38.1 32.7*  --   NA 134* 137 136  K 4.4 4.0 3.5  CL 99 105 101  CO2 27 27 29   BUN 19 14 15   CREATININE 0.71 0.63 0.71   Liver Panel Recent Labs    05/22/22 1218 05/23/22 0147  PROT 6.4* 5.6*  ALBUMIN 2.8* 2.5*  AST 23 18  ALT 19 16  ALKPHOS 114 95  BILITOT 0.5 0.4  BILIDIR 0.1  --   IBILI 0.4  --     Microbiology: Stool Cdiff from 05/23/22- neg GI panel Neg Studies/Results: No results found.   Assessment/Plan: 86 yr with recurrent hospitalization in the past 3 weeks Clostridium difficile Diarrhea with underlying fecal incontinence and baseline constipation alternating with diarrhea Admitted with weakness  Cdiff diarrhea- pt has been on appropriate treatment since 7/31 Repeat Cdiff test is negative GI panel is neg her underlying GI issues is now contributing to this Also Diabetes mellitus- not well controlled- could there be an  element of autonomic neuropathy? Continue fidoxamicin X 10 days Avoid antibiotics- dont treat just dysuria May need FMT if diarrhea recurs   Weakness-cortisol 5 am - borderline low- ? repeat   Anemia   ?h/o fall compression fracture L1   Wound rt upper thigh lateral aspect close to the surgical scar- does not look overtly infected- could be a sinus tract- just observe  Discussed the management with patient and her daughter. ID will sign off- call if needed

## 2022-05-24 NOTE — Progress Notes (Addendum)
Physical Therapy Treatment Patient Details Name: Lisa Lisa MRN: [id number] DOB: 08/31/1936 Today's Date: 05/24/2022   History of Present Illness Lisa Lisa is a 86 y/o female who presented to ER secondary to persistent weakness, ongoing diarrhea and inability to manage care in home environment; admitted for management of acute/chronic debility due to persistent CDiff infection.    PT Comments    Treatment emphasis this date on family training with daughters, Larene Beach and Marylu Lund, for sit/stand and basic transfers with RW.   Reviewed use of gait belt, transfer mechanics, hand placement/guarding and verbal cuing/instructions for patient.  All repetitions performed with cga/min assist from therapist to support and optimize safety.  Daughters with good cuing/communication to patient, but unable to consistently return demonstration without constant hands-on support from therapist at this time.  Both daughters feel unable to safely and effectively manage patient's needs in current state outside of hospital environment (especially given noted inconsistency in functional ability/performance over previous couple days) As such, will update discharge recommendations to STR at discharge to allow for more intensive course of rehab prior to return home.  If family remains uncertain of ability to provide care in home post-rehab, may consider transition to LTC as appropriate. TOC/MD informed and aware.    Recommendations for follow up therapy are one component of a multi-disciplinary discharge planning process, led by the attending physician.  Recommendations may be updated based on patient status, additional functional criteria and insurance authorization.  Follow Up Recommendations  Skilled nursing-short term rehab (<3 hours/day) Can patient physically be transported by private vehicle: Yes   Assistance Recommended at Discharge    Patient can return home with the following A little help with  walking and/or transfers;A little help with bathing/dressing/bathroom;Assistance with cooking/housework;Assist for transportation;Help with stairs or ramp for entrance;Direct supervision/assist for medications management;Direct supervision/assist for financial management   Equipment Recommendations       Recommendations for Other Services       Precautions / Restrictions Precautions Precautions: Fall Restrictions Weight Bearing Restrictions: No     Mobility  Bed Mobility Overal bed mobility: Needs Assistance Bed Mobility: Supine to Sit     Supine to sit: Min assist          Transfers Overall transfer level: Needs assistance Equipment used: Rolling walker (2 wheels) Transfers: Sit to/from Stand Sit to Stand: Min assist           General transfer comment: emphasis on hand/foot placement, weight shift and transfer mechanics    Ambulation/Gait Ambulation/Gait assistance: Min assist Gait Distance (Feet): 20 Feet Assistive device: Rolling walker (2 wheels)         General Gait Details: mildly flexed posture with short-shuffling steps; consistent cuing for postural extension.  Mod assist for walker position/management   Stairs             Wheelchair Mobility    Modified Rankin (Stroke Patients Only)       Balance Overall balance assessment: Needs assistance Sitting-balance support: No upper extremity supported, Feet supported Sitting balance-Leahy Scale: Good     Standing balance support: Bilateral upper extremity supported Standing balance-Leahy Scale: Fair                              Cognition Arousal/Alertness: Awake/alert Behavior During Therapy: WFL for tasks assessed/performed Overall Cognitive Status: History of cognitive impairments - at baseline  General Comments: Oriented to self/location/time, grossly to situation; follows commands, pleasant and cooperative         Exercises Other Exercises Other Exercises: Family training with daughters, Larene Beach and Marylu Lund, for sit/stand and basic transfers.  Reviewed use of gait belt, transfer mechanics, hand placement/guarding and verbal cuing/instructions for patient.  All repetitions performed with cga/min assist from therapist to support and optimize safety.  Daughters with good cuing/communication to patient, but unable to consistently return demonstration without constant hands-on support from therapist at this time.  Both verbalize discomfort/uncertainty managing patient in current state outside of hospital environment after training (especially given noted inconsistency in functional ability/performance over previous couple days) Other Exercises: Seated LE therex, 1x10, active ROM for LE strength and endurance.  Often requires hand-over-hand for task comprehension due to visual deficits.    General Comments        Pertinent Vitals/Pain Pain Assessment Pain Assessment: No/denies pain    Home Living                          Prior Function            PT Goals (current goals can now be found in the care plan section) Acute Rehab PT Goals Patient Stated Goal: get stronger, be able to go back home with family PT Goal Formulation: With patient/family Time For Goal Achievement: 06/06/22 Potential to Achieve Goals: Good Progress towards PT goals: Progressing toward goals    Frequency    Min 2X/week      PT Plan Discharge plan needs to be updated    Co-evaluation              AM-PAC PT "6 Clicks" Mobility   Outcome Measure  Help needed turning from your back to your side while in a flat bed without using bedrails?: A Little Help needed moving from lying on your back to sitting on the side of a flat bed without using bedrails?: A Little Help needed moving to and from a bed to a chair (including a wheelchair)?: A Little Help needed standing up from a chair using your arms (e.g.,  wheelchair or bedside chair)?: A Little Help needed to walk in hospital room?: A Little Help needed climbing 3-5 steps with a railing? : A Lot 6 Click Score: 17    End of Session Equipment Utilized During Treatment: Gait belt Activity Tolerance: Patient tolerated treatment well Patient left: in chair;with chair alarm set;with family/visitor present Nurse Communication: Mobility status PT Visit Diagnosis: Muscle weakness (generalized) (M62.81);Difficulty in walking, not elsewhere classified (R26.2);Unsteadiness on feet (R26.81);History of falling (Z91.81)     Time: 3570-1779 PT Time Calculation (min) (ACUTE ONLY): 56 min  Charges:  $Gait Training: 8-22 mins $Therapeutic Exercise: 8-22 mins $Therapeutic Activity: 23-37 mins                    Karilyn Wind H. Manson Passey, PT, DPT, NCS 05/24/22, 11:28 AM [phone]

## 2022-05-24 NOTE — Plan of Care (Signed)

## 2022-05-24 NOTE — Progress Notes (Signed)
PROGRESS NOTE  Lisa Davis [id number] DOB: Apr 02, 1936 DOA: 05/22/2022 PCP: Alma Friendly, MD  Brief History   Lisa Davis is a 86 y.o. female with medical history significant of  depression, anxiety, non-insulin-dependent diabetes mellitus, hypertension, neuropathy, hypothyroid, GERD,C-dif infection, who has interim history of recent admission  05/16/22-05/21/22 for generalized weakness and inability to ambulate on her own. Of note patient also has history diagnosis of C-dif colitis on 05/07/2022 for which she was treated with po vanc qid x 10 days with completion of course onf 05/18/2022. ON return to ED  on 8/9 patient was noted to have continued diarrhea and was switched to Dficid for 10 days with end dated of 05/27/22. Patient prior to discharge was evaluated by PT who recommended SNF however family declined placement and patient was discharged home. Patient now return less than 24 hours later with family noting that patient is 3 person assist and continues to have profuse diarrhea. They have noted that they are unable to provide the care for her at home, so she was brought back to ED.  Patient daughter at bedside who give history notes, patient has continued increase stools at home but no complaints of fever chills, no n/v/abd pain or sob.However notes severe generalize weakness .  The patient has been admitted with enteric precautions. She is still receiving dificid. ID has been consulted. Her magnesium has been supplemented. Recommendation is to continue dificid x 10 days.   PT/OT has evaluated the patient and has recommended that the patient discharge to home with home health PT/OT.  Consultants  Infectious disease  Procedures  None  Antibiotics   Anti-infectives (From admission, onward)    Start     Dose/Rate Route Frequency Ordered Stop   05/22/22 2200  fidaxomicin (DIFICID) tablet 200 mg        200 mg Oral 2 times daily 05/22/22 1526        Subjective  The patient  is resting comfortably. No further bowel movements.  Objective   Vitals:  Vitals:   05/24/22 0734 05/24/22 1542  BP: (!) 106/54 (!) 104/41  Pulse: 79 79  Resp: 18 18  Temp: 97.9 F (36.6 C) 98.1 F (36.7 C)  SpO2: 95% 93%    Exam:  Constitutional:  The patient is awake, alert, and oriented x 3. No acute distress. Respiratory:  No increased work of breathing. No wheezes, rales, or rhonchi No tactile fremitus Cardiovascular:  Regular rate and rhythm No murmurs, ectopy, or gallups. No lateral PMI. No thrills. Abdomen:  Abdomen is soft, non-tender, non-distended No hernias, masses, or organomegaly Normoactive bowel sounds.  Musculoskeletal:  No cyanosis, clubbing, or edema Skin:  No rashes, lesions, ulcers palpation of skin: no induration or nodules Neurologic:  CN 2-12 intact Sensation all 4 extremities intact Psychiatric:  Mental status Mood, affect appropriate Orientation to person, place, time  judgment and insight appear intact   I have personally reviewed the following:   Today's Data  Vitals  Lab Data  CBC BMP Magnesium Glucoses  Micro Data    Imaging    Cardiology Data  EKG  Other Data    Scheduled Meds:  (feeding supplement) PROSource Plus  30 mL Oral TID BM   amitriptyline  50 mg Oral QHS   calcium-vitamin D  1 tablet Oral Daily   clorazepate  3.75 mg Oral BID   feeding supplement  1 Container Oral TID BM   fidaxomicin  200 mg Oral BID   fluticasone  1 spray Each  Nare Daily   folic acid  1 mg Oral Daily   heparin  5,000 Units Subcutaneous Q8H   insulin aspart  0-15 Units Subcutaneous TID WC   insulin detemir  17 Units Subcutaneous QHS   iron polysaccharides  150 mg Oral Daily   latanoprost  1 drop Both Eyes QHS   levothyroxine  75 mcg Oral QAC breakfast   losartan  25 mg Oral Daily   multivitamin with minerals  1 tablet Oral Daily   perphenazine  4 mg Oral QHS   propranolol  20 mg Oral BID   sertraline  150 mg Oral Daily    traMADol  50 mg Oral Q12H   zolpidem  5 mg Oral QHS   Continuous Infusions:  Principal Problem:   Debility Active Problems:   C. difficile colitis   Hypomagnesemia   Acute on chronic urinary retention   Major depressive disorder   Uncontrolled type 2 diabetes mellitus with hyperglycemia, without long-term current use of insulin (HCC)   GERD (gastroesophageal reflux disease)   Hypertension   Hypothyroidism   LOS: 1 day   A & P  Assessment and Plan: * Debility The patient was discharged to home from this facility on 05/18/2022. At that time the patient was recommended to discharge to rehab by PT. Patient and family opted to go home. Initially the patient was able to transfer from wheelchair to bed, but soon the patient's diarrhea returned, and she was too weak to get up off of the toilet.   The patient is found to require too much assistance for her to be safe at home. TOC has been consulted to help find placement.  C. difficile colitis The patient had been discharged to home on 6 days of dificid. This has been continued as inpatient. The patient stated that she had had one loose BM this morning. Dr. Rivka Safer has been consulted to help determine if this is indeed continued C Diff colitis, recurrent C Diff, or simply ongoing diarrhea. If she is not currently infected, it is possible that her diarrhea could be addressed with loparimide.   Stool has been sent for C Diff testing. ID has recommended completing 10 days of Dificid. It is possible that the patient may require FMT.  The patient does have a history of intermittent constipation/diarrhea as well as incontinence.  The patient states that she has had no further diarrhea.  Hypomagnesemia Resolved. Monitor.  Acute on chronic urinary retention - monitor uop  -strict I/o  -monitor for signs of retention     Major depressive disorder Continue on Tranxene/Zoloft.  GERD (gastroesophageal reflux disease) The patient is  receiving a H2 blocker. PPI is held due to C Diff colitis.  Uncontrolled type 2 diabetes mellitus with hyperglycemia, without long-term current use of insulin (HCC) HbA1c is 8.5. The patient's glucoses are controlled with Levemir 10 units sub Q daily and FSBS and SSI.   Glucoses in the past 24 hours have run from 144-324. Will increase lantus to 17 units daily.  Hypertension The patient is normotensive on losartan and propranolol.  Hypothyroidism Continue synthroid 75 mcg daily as at home.    I have seen and examined this patient myself. I have spent 34 minutes in her evaluation and care.  DVT prophylaxis: Heparin Code Status: DNR Family Communication: Daughters at bedside. All questions answered to the best of my ability. Disposition Plan: Home with home health PT/OT    Yerania Chamorro, DO [facility] Direct contact: see [email or web address]  7PM-7AM contact night coverage as above 05/24/2022, 3:52 PM  LOS: 0 days

## 2022-05-25 DIAGNOSIS — R5381 Other malaise: Secondary | ICD-10-CM | POA: Diagnosis not present

## 2022-05-25 LAB — BASIC METABOLIC PANEL
Anion gap: 7 (ref 5–15)
BUN: 13 mg/dL (ref 8–23)
CO2: 27 mmol/L (ref 22–32)
Calcium: 8.7 mg/dL — ABNORMAL LOW (ref 8.9–10.3)
Chloride: 102 mmol/L (ref 98–111)
Creatinine, Ser: 0.56 mg/dL (ref 0.44–1.00)
GFR, Estimated: >60 mL/min (ref >60)
Glucose, Bld: 185 mg/dL — ABNORMAL HIGH (ref 70–99)
Potassium: 4.1 mmol/L (ref 3.5–5.1)
Sodium: 136 mmol/L (ref 135–145)

## 2022-05-25 LAB — CBC WITH DIFFERENTIAL/PLATELET
Abs Immature Granulocytes: 0.53 10*3/uL — ABNORMAL HIGH (ref 0.00–0.07)
Basophils Absolute: 0.1 10*3/uL (ref 0.0–0.1)
Basophils Relative: 1 %
Eosinophils Absolute: 0.6 10*3/uL — ABNORMAL HIGH (ref 0.0–0.5)
Eosinophils Relative: 5 %
HCT: 29.8 % — ABNORMAL LOW (ref 36.0–46.0)
Hemoglobin: 9.4 g/dL — ABNORMAL LOW (ref 12.0–15.0)
Immature Granulocytes: 5 %
Lymphocytes Relative: 20 %
Lymphs Abs: 2.2 10*3/uL (ref 0.7–4.0)
MCH: 28.2 pg (ref 26.0–34.0)
MCHC: 31.5 g/dL (ref 30.0–36.0)
MCV: 89.5 fL (ref 80.0–100.0)
Monocytes Absolute: 0.6 10*3/uL (ref 0.1–1.0)
Monocytes Relative: 6 %
Neutro Abs: 6.9 10*3/uL (ref 1.7–7.7)
Neutrophils Relative %: 63 %
Platelets: 385 10*3/uL (ref 150–400)
RBC: 3.33 MIL/uL — ABNORMAL LOW (ref 3.87–5.11)
RDW: 16.1 % — ABNORMAL HIGH (ref 11.5–15.5)
WBC: 10.9 10*3/uL — ABNORMAL HIGH (ref 4.0–10.5)
nRBC: 0 % (ref 0.0–0.2)

## 2022-05-25 LAB — CORTISOL: Cortisol, Plasma: 6 ug/dL

## 2022-05-25 LAB — GLUCOSE, CAPILLARY
Glucose-Capillary: 243 mg/dL — ABNORMAL HIGH (ref 70–99)
Glucose-Capillary: 196 mg/dL — ABNORMAL HIGH (ref 70–99)
Glucose-Capillary: 191 mg/dL — ABNORMAL HIGH (ref 70–99)
Glucose-Capillary: 194 mg/dL — ABNORMAL HIGH (ref 70–99)

## 2022-05-25 LAB — T3, FREE: T3, Free: 2.2 pg/mL (ref 2.0–4.4)

## 2022-05-25 MED ORDER — INSULIN ASPART 100 UNIT/ML IJ SOLN
3.0000 [IU] | Freq: Three times a day (TID) | INTRAMUSCULAR | Status: DC
  Administered 2022-05-25 – 2022-05-26 (×2): 3 [IU] via SUBCUTANEOUS
  Filled 2022-05-25 (×2): qty 1

## 2022-05-25 NOTE — Progress Notes (Signed)
Mobility Specialist - Progress Note   05/25/22 1600  Mobility  Activity Ambulated with assistance in hallway  Level of Assistance Minimal assist, patient does 75% or more  Assistive Device Front wheel walker  Distance Ambulated (ft) 55 ft  Activity Response Tolerated well  $Mobility charge 1 Mobility    During mobility: 105 HR, 91% SpO2    Pt lying in bed upon arrival, utilizing RA. Pt sat EOB with minA; no dizziness. Tactile cues for hand placement onto RW. Pt ambulated in hallway with minA---persistent directional cueing and RW placement/navigation d/t vision impairment. Steps R outside of RW. Flexed posture. Fatigued with activity. 2 seated rest breaks with chair follow provided. Pt returned to bed with alarm set, needs in reach.    Filiberto Pinks Mobility Specialist 05/25/22, 4:29 PM

## 2022-05-25 NOTE — Progress Notes (Signed)
PROGRESS NOTE  Lisa Davis QMV:[id number] DOB: [birth year] DOA: 05/22/2022 PCP: Vernona Rieger, MD  Brief History   Lisa Davis is a 86 y.o. female with medical history significant of  depression, anxiety, non-insulin-dependent diabetes mellitus, hypertension, neuropathy, hypothyroid, GERD,C-dif infection, who has interim history of recent admission  05/16/22-05/21/22 for generalized weakness and inability to ambulate on her own. Of note patient also has history diagnosis of C-dif colitis on 05/07/2022 for which she was treated with po vanc qid x 10 days with completion of course onf 05/18/2022. ON return to ED  on 8/9 patient was noted to have continued diarrhea and was switched to Dficid for 10 days with end dated of 05/27/22. Patient prior to discharge was evaluated by PT who recommended SNF however family declined placement and patient was discharged home. Patient now return less than 24 hours later with family noting that patient is 3 person assist and continues to have profuse diarrhea. They have noted that they are unable to provide the care for her at home, so she was brought back to ED.  Patient daughter at bedside who give history notes, patient has continued increase stools at home but no complaints of fever chills, no n/v/abd pain or sob.However notes severe generalize weakness .  The patient has been admitted with enteric precautions. She is still receiving dificid. ID has been consulted. Her magnesium has been supplemented. Recommendation is to continue dificid x 10 days.   PT/OT has evaluated the patient and has recommended that the patient discharge to home with home health PT/OT.  Consultants  Infectious disease  Procedures  None  Antibiotics   Anti-infectives (From admission, onward)    Start     Dose/Rate Route Frequency Ordered Stop   05/22/22 2200  fidaxomicin (DIFICID) tablet 200 mg        200 mg Oral 2 times daily 05/22/22 1526        Subjective  The patient  is resting comfortably. No further bowel movements.  Objective   Vitals:  Vitals:   05/25/22 0733 05/25/22 1136  BP: (!) 106/37 (!) 121/46  Pulse: 81 78  Resp: 16 17  Temp: 98.3 F (36.8 C) 98.1 F (36.7 C)  SpO2: 92% 100%    Exam:  Constitutional:  The patient is awake, alert, and oriented x 3. No acute distress. Respiratory:  No increased work of breathing. No wheezes, rales, or rhonchi No tactile fremitus Cardiovascular:  Regular rate and rhythm No murmurs, ectopy, or gallups. No lateral PMI. No thrills. Abdomen:  Abdomen is soft, non-tender, non-distended No hernias, masses, or organomegaly Normoactive bowel sounds.  Musculoskeletal:  No cyanosis, clubbing, or edema Skin:  No rashes, lesions, ulcers palpation of skin: no induration or nodules Neurologic:  CN 2-12 intact Sensation all 4 extremities intact Psychiatric:  Mental status Mood, affect appropriate Orientation to person, place, time  judgment and insight appear intact   I have personally reviewed the following:   Today's Data  Vitals  Lab Data  CBC BMP Magnesium Glucoses  Micro Data    Imaging    Cardiology Data  EKG  Other Data    Scheduled Meds:  (feeding supplement) PROSource Plus  30 mL Oral TID BM   amitriptyline  50 mg Oral QHS   calcium-vitamin D  1 tablet Oral Daily   clorazepate  3.75 mg Oral BID   feeding supplement  1 Container Oral TID BM   fidaxomicin  200 mg Oral BID   fluticasone  1 spray Each  Nare Daily   folic acid  1 mg Oral Daily   heparin  5,000 Units Subcutaneous Q8H   insulin aspart  0-15 Units Subcutaneous TID WC   insulin aspart  3 Units Subcutaneous TID WC   insulin detemir  17 Units Subcutaneous QHS   iron polysaccharides  150 mg Oral Daily   latanoprost  1 drop Both Eyes QHS   levothyroxine  75 mcg Oral QAC breakfast   losartan  25 mg Oral Daily   multivitamin with minerals  1 tablet Oral Daily   perphenazine  4 mg Oral QHS   propranolol   20 mg Oral BID   sertraline  150 mg Oral Daily   traMADol  50 mg Oral Q12H   zolpidem  5 mg Oral QHS   Continuous Infusions:  Principal Problem:   Debility Active Problems:   C. difficile colitis   Hypomagnesemia   Acute on chronic urinary retention   Major depressive disorder   Uncontrolled type 2 diabetes mellitus with hyperglycemia, without long-term current use of insulin (HCC)   GERD (gastroesophageal reflux disease)   Hypertension   Hypothyroidism   LOS: 2 days   A & P  Assessment and Plan: * Debility The patient was discharged to home from this facility on 05/18/2022. At that time the patient was recommended to discharge to rehab by PT. Patient and family opted to go home. Initially the patient was able to transfer from wheelchair to bed, but soon the patient's diarrhea returned, and she was too weak to get up off of the toilet.   The patient is found to require too much assistance for her to be safe at home. TOC has been consulted to help find placement.  C. difficile colitis The patient had been discharged to home on 6 days of dificid. This has been continued as inpatient. The patient stated that she had had one loose BM this morning. Dr. Rivka Safer has been consulted to help determine if this is indeed continued C Diff colitis, recurrent C Diff, or simply ongoing diarrhea.RStool was sent for repeat C Diff testing which was negative. ID has recommended completing 10 days of Dificid. It is possible that the patient may require FMT.  The patient does have a history of intermittent constipation/diarrhea as well as incontinence.  The patient states that she has had no further diarrhea.  Hypomagnesemia Resolved. Monitor.  Acute on chronic urinary retention - monitor uop  -strict I/o  -monitor for signs of retention     Major depressive disorder Continue on Tranxene/Zoloft.  GERD (gastroesophageal reflux disease) The patient is receiving a H2 blocker. PPI is held  due to C Diff colitis.  Uncontrolled type 2 diabetes mellitus with hyperglycemia, without long-term current use of insulin (HCC) HbA1c is 8.5. The patient's glucoses are controlled with Levemir 10 units sub Q daily and FSBS and SSI.   Glucoses in the past 24 hours have run from 144-324. Will increase lantus to 17 units daily with novolog 3 units with meals.  Hypertension The patient is normotensive on losartan and propranolol.  Hypothyroidism Continue synthroid 75 mcg daily as at home.    I have seen and examined this patient myself. I have spent 34 minutes in her evaluation and care.  DVT prophylaxis: Heparin Code Status: DNR Family Communication: Daughters at bedside. All questions answered to the best of my ability. Disposition Plan: Home with home health PT/OT    Lisa Soulier, DO [facility] Direct contact: see [email or web address]  7PM-7AM  contact night coverage as above 05/25/2022, 4:06 PM  LOS: 0 days

## 2022-05-25 NOTE — TOC Progression Note (Signed)
Transition of Care [facility]) - Progression Note    Patient Details  Name: Lisa Davis MRN: [id number] Date of Birth: [birth year]  Transition of [facility]) CM/SW Contact  Truddie Hidden, RN Phone Number: 05/25/2022, 4:27 PM  Clinical Narrative:    Sherron Monday with Verlon Au from [facility]. Verlon Au stated she had talked to the admissions team and they had decided they would not be able to extended a bed offer. Spoke with patient's daughter. Patient daughter stated they had decided to take the patient home and would set up care.        Expected Discharge Plan and Services                                                 Social Determinants of Health (SDOH) Interventions    Readmission Risk Interventions     No data to display

## 2022-05-25 NOTE — Inpatient Diabetes Management (Signed)
Inpatient Diabetes Program Recommendations  AACE/ADA: New Consensus Statement on Inpatient Glycemic Control   Target Ranges:  Prepandial:   less than 140 mg/dL      Peak postprandial:   less than 180 mg/dL (1-2 hours)      Critically ill patients:  140 - 180 mg/dL    Latest Reference Range & Units 05/24/22 07:44 05/24/22 11:51 05/24/22 11:54 05/24/22 17:33 05/24/22 17:35 05/24/22 22:41  Glucose-Capillary 70 - 99 mg/dL 83 790 (H) 383 (H) 338 (H) 277 (H) 127 (H)     Current orders for Inpatient glycemic control: Levemir 17 units QHS, Novolog 0-15 units TID with meals  Inpatient Diabetes Program Recommendations:    Insulin: Levemir 10 units given on 05/23/22 at 22:27 and fasting glucose 83 mg/dl on 01/03/18. Per MAR, patient REFUSED Levemir last night and fasting glucose 188 mg/dl today. Post prandial glucose consistently elevated (up to 333 mg/dl on 1/66/06).  Please consider decreasing Levemir to 10 units QHS and adding Novolog 3 units TID with meals for meal coverage if patient eats at least 50% of meals.  Thanks, Orlando Penner, RN, MSN, CDCES Diabetes Coordinator Inpatient Diabetes Program [phone] (Team Pager from 8am to 5pm)

## 2022-05-25 NOTE — Care Management Important Message (Signed)
Important Message  Patient Details  Name: Lisa Davis MRN: [id number] Date of Birth: May 21, 1936   Medicare Important Message Given:  Yes  I reviewed the Important Message from Medicare with the patient's daughter, [name] by phone [phone]. We spoke at patients last admission and she stated she was aware of her rights and thanked me for calling.  I wished her mom well and wished her a good afternoon.    Olegario Messier A Bryla Burek 05/25/2022, 11:02 AM

## 2022-05-26 DIAGNOSIS — R5381 Other malaise: Secondary | ICD-10-CM | POA: Diagnosis not present

## 2022-05-26 LAB — BASIC METABOLIC PANEL
Anion gap: 5 (ref 5–15)
BUN: 13 mg/dL (ref 8–23)
CO2: 29 mmol/L (ref 22–32)
Calcium: 8.9 mg/dL (ref 8.9–10.3)
Chloride: 104 mmol/L (ref 98–111)
Creatinine, Ser: 0.52 mg/dL (ref 0.44–1.00)
GFR, Estimated: >60 mL/min (ref >60)
Glucose, Bld: 106 mg/dL — ABNORMAL HIGH (ref 70–99)
Potassium: 4.3 mmol/L (ref 3.5–5.1)
Sodium: 138 mmol/L (ref 135–145)

## 2022-05-26 LAB — CBC WITH DIFFERENTIAL/PLATELET
Abs Immature Granulocytes: 0.44 10*3/uL — ABNORMAL HIGH (ref 0.00–0.07)
Basophils Absolute: 0.1 10*3/uL (ref 0.0–0.1)
Basophils Relative: 1 %
Eosinophils Absolute: 0.5 10*3/uL (ref 0.0–0.5)
Eosinophils Relative: 5 %
HCT: 31.4 % — ABNORMAL LOW (ref 36.0–46.0)
Hemoglobin: 9.7 g/dL — ABNORMAL LOW (ref 12.0–15.0)
Immature Granulocytes: 5 %
Lymphocytes Relative: 21 %
Lymphs Abs: 2 10*3/uL (ref 0.7–4.0)
MCH: 28.5 pg (ref 26.0–34.0)
MCHC: 30.9 g/dL (ref 30.0–36.0)
MCV: 92.4 fL (ref 80.0–100.0)
Monocytes Absolute: 0.6 10*3/uL (ref 0.1–1.0)
Monocytes Relative: 6 %
Neutro Abs: 6 10*3/uL (ref 1.7–7.7)
Neutrophils Relative %: 62 %
Platelets: 382 10*3/uL (ref 150–400)
RBC: 3.4 MIL/uL — ABNORMAL LOW (ref 3.87–5.11)
RDW: 16.1 % — ABNORMAL HIGH (ref 11.5–15.5)
WBC: 9.6 10*3/uL (ref 4.0–10.5)
nRBC: 0 % (ref 0.0–0.2)

## 2022-05-26 LAB — GLUCOSE, CAPILLARY
Glucose-Capillary: 62 mg/dL — ABNORMAL LOW (ref 70–99)
Glucose-Capillary: 224 mg/dL — ABNORMAL HIGH (ref 70–99)

## 2022-05-26 LAB — ACTH: C206 ACTH: 6.8 pg/mL — ABNORMAL LOW (ref 7.2–63.3)

## 2022-05-26 MED ORDER — FOLIC ACID 1 MG PO TABS: 1.0000 mg | ORAL_TABLET | Freq: Every day | ORAL | 0 refills | Status: AC

## 2022-05-26 MED ORDER — AMITRIPTYLINE HCL 50 MG PO TABS: 50.0000 mg | ORAL_TABLET | Freq: Every day | ORAL | 0 refills | Status: AC

## 2022-05-26 MED ORDER — OYSTER SHELL CALCIUM/D3 500-5 MG-MCG PO TABS: 1.0000 | ORAL_TABLET | Freq: Every day | ORAL | 0 refills | Status: AC

## 2022-05-26 MED ORDER — FLUTICASONE PROPIONATE 50 MCG/ACT NA SUSP: 1.0000 | Freq: Every day | NASAL | 0 refills | Status: AC

## 2022-05-26 MED ORDER — PROSOURCE PLUS PO LIQD: 30.0000 mL | Freq: Three times a day (TID) | ORAL | 0 refills | Status: AC

## 2022-05-26 MED ORDER — FIDAXOMICIN 200 MG PO TABS: 200.0000 mg | ORAL_TABLET | Freq: Two times a day (BID) | ORAL | 0 refills | Status: AC

## 2022-05-26 NOTE — TOC Transition Note (Signed)
Transition of Care [facility]) - CM/SW Discharge Note   Patient Details  Name: Lisa Davis MRN: [id number] Date of Birth: 01/27/1936  Transition of Care [facility]) CM/SW Contact:  Bing Quarry, RN Phone Number: 05/26/2022, 12:16 PM   Clinical Narrative:  8/19: Patient to be discharged today. Spoke with daughter,  [name] regarding discharge plan and follow up appointments next Monday. She will transport patient home and to appointment. Has needed DME at home.  PCP. Dr. Vernona Rieger RX: [id number] - [place], [place] - [address] Barbie Ras Kollman RN CM  Campbell,Vickie (Daughter)  [phone] (Mobile) Final next level of care: Home w Home Health Services Barriers to Discharge: Barriers Resolved   Patient Goals and CMS Choice        Discharge Placement                  Name of family member notified: [name] (Daughter)   [phone] (Mobile) Patient and family notified of of transfer: 05/26/22 (Notified of discharge and discharge plan.)  Discharge Plan and Services                DME Arranged: N/A (Daughter says has all needed DME at home.) DME Agency: NA       HH Arranged: PT, OT, Nurse's Aide HH Agency: [facility] Date HH Agency Contacted: 05/26/22 Time HH Agency Contacted: 1215 Representative spoke with at [facility] Agency: Feliberto Gottron ( has reached out to daughter already)  Social Determinants of Health (SDOH) Interventions     Readmission Risk Interventions     No data to display

## 2022-05-26 NOTE — Progress Notes (Signed)
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   OT comments  Pt's Purewick had not worked correctly, and pt, clothing, & bedding soaked in urine. Pt able to come to EOB sitting (Mod A), complete grooming and UB bathing while seated, w/ SUPV following set-up, dons shirt with Min A and verbal/tactile cueing for finding shirt sleeves, 2/2 visual impairment. Pt able to come into standing with RW, Mod A, with posterior lean. Needed Max A for pericare, LB dressing, and LB bathing. NT Sam assists with washing up and changing linens. Provided educ to pt and daughter re: transfer techniques, falls prevention, dressing techniques. Have updated DC recs to home with HHOT, which pt's children have now decided will be helpful for pt.    Recommendations for follow up therapy are one component of a multi-disciplinary discharge planning process, led by the attending physician.  Recommendations may be updated based on patient status, additional functional criteria and insurance authorization.    Follow Up Recommendations  Home health OT    Assistance Recommended at Discharge    Patient can return home with the following  Assistance with cooking/housework;Assist for transportation;Help with stairs or ramp for entrance;Direct supervision/assist for medications management;Direct supervision/assist for financial management;A little help with bathing/dressing/bathroom;A little help with walking and/or transfers   Equipment Recommendations  None recommended by OT    Recommendations for Other Services      Precautions / Restrictions Precautions Precautions:  Fall Restrictions Weight Bearing Restrictions: No       Mobility Bed Mobility Overal bed mobility: Needs Assistance Bed Mobility: Supine to Sit, Sit to Supine     Supine to sit: Mod assist Sit to supine: Mod assist   General bed mobility comments: Mod A for repositioning b/l LE    Transfers Overall transfer level: Needs assistance Equipment used: Rolling walker (2 wheels) Transfers: Sit to/from Stand Sit to Stand: Min assist                 Balance Overall balance assessment: Needs assistance Sitting-balance support: No upper extremity supported, Feet supported Sitting balance-Leahy Scale: Good     Standing balance support: Bilateral upper extremity supported, During functional activity Standing balance-Leahy Scale: Fair Standing balance comment: able to maintain standing balance with RW and Mod A for ~ 2 minutes, for pericare, LB dressing, linen change                           ADL either performed or assessed with clinical judgement   ADL Overall ADL's : Needs assistance/impaired     Grooming: Wash/dry face;Sitting;Supervision/safety;Set up;Wash/dry hands Grooming Details (indicate cue type and reason): Pt completed grooming tasks while sitting EOB with set up A and supervision for safety Upper Body Bathing: Supervision/ safety;Sitting Upper Body Bathing Details (indicate cue type and reason): cueing for task continuation, thoroughness Lower Body Bathing: Maximal assistance   Upper Body Dressing : Minimal assistance   Lower Body Dressing: Maximal assistance Lower Body Dressing Details (indicate cue type and reason): Pt able to assist minimall with pulling up pants; max a for donning shoes     Toileting- Clothing Manipulation and Hygiene: Maximal assistance  Extremity/Trunk Assessment Upper Extremity Assessment Upper Extremity Assessment: Generalized weakness   Lower Extremity Assessment Lower Extremity Assessment:  Generalized weakness        Vision Baseline Vision/History: 2 Legally blind Ability to See in Adequate Light: 2 Moderately impaired Patient Visual Report: No change from baseline     Perception     Praxis      Cognition Arousal/Alertness: Awake/alert Behavior During Therapy: WFL for tasks assessed/performed Overall Cognitive Status: Within Functional Limits for tasks assessed                                          Exercises Other Exercises Other Exercises: Educ pt and family re: falls prevention, dressing techniques    Shoulder Instructions       General Comments      Pertinent Vitals/ Pain       Pain Assessment Pain Assessment: No/denies pain  Home Living                                          Prior Functioning/Environment              Frequency  Min 2X/week        Progress Toward Goals  OT Goals(current goals can now be found in the care plan section)  Progress towards OT goals: Progressing toward goals  Acute Rehab OT Goals OT Goal Formulation: With patient/family Time For Goal Achievement: 06/06/22 Potential to Achieve Goals: Good  Plan Frequency remains appropriate;Discharge plan needs to be updated    Co-evaluation                 AM-PAC OT "6 Clicks" Daily Activity     Outcome Measure   Help from another person eating meals?: A Little Help from another person taking care of personal grooming?: A Little Help from another person toileting, which includes using toliet, bedpan, or urinal?: A Lot Help from another person bathing (including washing, rinsing, drying)?: A Lot Help from another person to put on and taking off regular upper body clothing?: A Little Help from another person to put on and taking off regular lower body clothing?: A Lot 6 Click Score: 15    End of Session Equipment Utilized During Treatment: Rolling walker (2 wheels)  OT Visit Diagnosis: Muscle weakness (generalized)  (M62.81);History of falling (Z91.81);Low vision, both eyes (H54.2)   Activity Tolerance Patient tolerated treatment well   Patient Left in bed;with call bell/phone within reach;with bed alarm set;with nursing/sitter in room;with family/visitor present   Nurse Communication Mobility status        Time: 5427-0623 OT Time Calculation (min): 33 min  Charges: OT General Charges $OT Visit: 1 Visit OT Treatments $Self Care/Home Management : 23-37 mins Latina Craver, PhD, MS, OTR/L 05/26/22, 3:27 PM

## 2022-05-26 NOTE — Discharge Summary (Addendum)
Physician Discharge Summary   Patient: Lisa Davis MRN: [id number] DOB: [birth year]  Admit date:     05/22/2022  Discharge date: 05/26/22  Discharge Physician: Fran Lowes   PCP: Vernona Rieger, MD   Recommendations at discharge:    Discharge to home with family supervision and home health and 4 wheeled walker. Follow up with PCP in 7-10 days. CBC and chemistry to be drawn on that visit and reported to PCP. Recheck TSH, FT3, and FT4 in 6 weeks.  Discharge Diagnoses: Principal Problem:   Debility Active Problems:   C. difficile colitis   Hypomagnesemia   Acute on chronic urinary retention   Major depressive disorder   Uncontrolled type 2 diabetes mellitus with hyperglycemia, without long-term current use of insulin (HCC)   GERD (gastroesophageal reflux disease)   Hypertension   Hypothyroidism  Resolved Problems:   * No resolved hospital problems. [facility] Course: Lisa Davis is a 86 y.o. female with medical history significant of  depression, anxiety, non-insulin-dependent diabetes mellitus, hypertension, neuropathy, hypothyroid, GERD,C-dif infection, who has interim history of recent admission  05/16/22-05/21/22 for generalized weakness and inability to ambulate on her own. Of note patient also has history diagnosis of C-dif colitis on 05/07/2022 for which she was treated with po vanc qid x 10 days with completion of course onf 05/18/2022. ON return to ED  on 8/9 patient was noted to have continued diarrhea and was switched to Dficid for 10 days with end dated of 05/27/22. Patient prior to discharge was evaluated by PT who recommended SNF however family declined placement and patient was discharged home. Patient now return less than 24 hours later with family noting that patient is 3 person assist and continues to have profuse diarrhea. They have noted that they are unable to provide the care for her at home, so she was brought back to ED.  Patient daughter at bedside who give  history notes, patient has continued increase stools at home but no complaints of fever chills, no n/v/abd pain or sob.However notes severe generalize weakness .   The patient has been admitted with enteric precautions. She is still receiving dificid. ID has been consulted. Her magnesium has been supplemented. Recommendation is to continue dificid x 10 days (4 days after discharge). Repeat C Diff was negative.   PT/OT has evaluated the patient and has recommended that the patient discharge to home with home health PT/OT. This is the family's plan.  Assessment and Plan: * Debility The patient was discharged to home from this facility on 05/18/2022. At that time the patient was recommended to discharge to rehab by PT. Patient and family opted to go home. Initially the patient was able to transfer from wheelchair to bed, but soon the patient's diarrhea returned, and she was too weak to get up off of the toilet.   The patient will discharge to home with her family with PT/OT home health.  C. difficile colitis The patient had been discharged to home on 6 days of dificid. This has been continued as inpatient. The patient stated that she had had one loose BM this morning. Dr. Rivka Safer has been consulted to help determine if this is indeed continued C Diff colitis, recurrent C Diff, or simply ongoing diarrhea.RStool was sent for repeat C Diff testing which was negative. ID has recommended completing 10 days of Dificid (4 more days).  The patient does have a history of intermittent constipation/diarrhea as well as incontinence.  The patient states that she has had  no further diarrhea.  Hypomagnesemia Resolved. Monitor.  Acute on chronic urinary retention - monitor uop  -strict I/o  No signs of urinary retention during this stay.    Major depressive disorder Continue on Tranxene/Zoloft.  GERD (gastroesophageal reflux disease) The patient is receiving a H2 blocker. PPI is held due to C Diff  colitis.  Uncontrolled type 2 diabetes mellitus with hyperglycemia, without long-term current use of insulin (HCC) HbA1c is 8.5. The patient's glucoses are controlled with Levemir 10 units sub Q daily and FSBS and SSI.   Glucoses in the past 24 hours have run from 62-224. The patient will be discharged to home on glipizide as prior to admission.  Hypertension The patient is normotensive on losartan and propranolol.  Hypothyroidism Continue synthroid 75 mcg daily as at home.          Consultants: Infectious disease Procedures performed: None  Disposition: Home health Diet recommendation:  Discharge Diet Orders (From admission, onward)     Start     Ordered   05/26/22 0000  Diet - low sodium heart healthy        05/26/22 1157   05/26/22 0000  Diet Carb Modified        05/26/22 1157           Cardiac and Carb modified diet DISCHARGE MEDICATION: Allergies as of 05/26/2022       Reactions   Chlorhexidine Rash   Chlorhexidine Gluconate Hives, Rash   Meperidine Nausea And Vomiting, Palpitations   Tachycardia Tachycardia Tachycardia   Statins Other (See Comments)   Intol to several   Meperidine Hcl    Tachycardia   Codeine Nausea And Vomiting   Metformin And Related Diarrhea   Morphine And Related Nausea And Vomiting   Pedi-pre Tape Spray [wound Dressing Adhesive] Rash   Povidone Iodine Rash        Medication List     STOP taking these medications    Calcium Carb-Cholecalciferol 600-10 MG-MCG Tabs Replaced by: calcium-vitamin D 500-5 MG-MCG tablet   Vitamin D (Ergocalciferol) 1.25 MG (50000 UNIT) Caps capsule Commonly known as: DRISDOL       TAKE these medications    (feeding supplement) PROSource Plus liquid Take 30 mLs by mouth 3 (three) times daily between meals.   acetaminophen 325 MG tablet Commonly known as: TYLENOL Take 325 mg by mouth as needed.   amitriptyline 50 MG tablet Commonly known as: ELAVIL Take 1 tablet (50 mg total) by  mouth at bedtime.   calcium-vitamin D 500-5 MG-MCG tablet Commonly known as: OSCAL WITH D Take 1 tablet by mouth daily. Start taking on: May 27, 2022 Replaces: Calcium Carb-Cholecalciferol 600-10 MG-MCG Tabs   clorazepate 3.75 MG tablet Commonly known as: TRANXENE Take 3.75 mg by mouth 2 (two) times daily.   fidaxomicin 200 MG Tabs tablet Commonly known as: DIFICID Take 1 tablet (200 mg total) by mouth 2 (two) times daily for 4 days.   fluticasone 50 MCG/ACT nasal spray Commonly known as: FLONASE Place 1 spray into both nostrils daily. Start taking on: May 27, 2022 What changed:  how much to take how to take this when to take this   folic acid 1 MG tablet Commonly known as: FOLVITE Take 1 tablet (1 mg total) by mouth daily. Start taking on: May 27, 2022   glipiZIDE 10 MG 24 hr tablet Commonly known as: GLUCOTROL XL Take 10 mg by mouth 2 (two) times daily.   iron polysaccharides 150 MG capsule Commonly known  as: NIFEREX Take 1 capsule (150 mg total) by mouth daily.   latanoprost 0.005 % ophthalmic solution Commonly known as: XALATAN Place 1 drop into both eyes at bedtime.   levothyroxine 75 MCG tablet Commonly known as: SYNTHROID Take 75 mcg by mouth daily before breakfast.   losartan 25 MG tablet Commonly known as: COZAAR Take 25 mg by mouth daily.   metFORMIN 500 MG tablet Commonly known as: GLUCOPHAGE Take 1,000 mg by mouth 2 (two) times daily with a meal.   multivitamin with minerals Tabs tablet Take 1 tablet by mouth daily.   pantoprazole 40 MG tablet Commonly known as: PROTONIX Take 40 mg by mouth daily.   perphenazine-amitriptyline 2-25 MG Tabs tablet Commonly known as: ETRAFON/TRIAVIL Take 2 tablets by mouth at bedtime.   propranolol 20 MG tablet Commonly known as: INDERAL Take 1 tablet by mouth 2 (two) times daily.   sertraline 100 MG tablet Commonly known as: ZOLOFT Take 1.5 tablets (150 mg total) by mouth daily.   traMADol 50  MG tablet Commonly known as: ULTRAM Take 1 tablet (50 mg total) by mouth in the morning and at bedtime.   zolpidem 6.25 MG CR tablet Commonly known as: AMBIEN CR Take 6.25 mg by mouth at bedtime.               Durable Medical Equipment  (From admission, onward)           Start     Ordered   05/26/22 1156  DME Walker  Once       Question Answer Comment  Walker: With 5 Inch Wheels   Patient needs a walker to treat with the following condition Ambulatory dysfunction      05/26/22 1157            Discharge Exam: Filed Weights   05/22/22 1620  Weight: 73 kg   Exam:  Constitutional:  The patient is awake, alert, and oriented x 3. No acute distress. Respiratory:  No increased work of breathing. No wheezes, rales, or rhonchi No tactile fremitus Cardiovascular:  Regular rate and rhythm No murmurs, ectopy, or gallups. No lateral PMI. No thrills. Abdomen:  Abdomen is soft, non-tender, non-distended No hernias, masses, or organomegaly Normoactive bowel sounds.  Musculoskeletal:  No cyanosis, clubbing, or edema Skin:  No rashes, lesions, ulcers palpation of skin: no induration or nodules Neurologic:  CN 2-12 intact Sensation all 4 extremities intact Psychiatric:  Mental status Mood, affect appropriate Orientation to person, place, time  judgment and insight appear intact   Condition at discharge: fair  The results of significant diagnostics from this hospitalization (including imaging, microbiology, ancillary and laboratory) are listed below for reference.   Imaging Studies: DG Chest Port 1 View  Result Date: 05/17/2022 CLINICAL DATA:  Hypoxia. EXAM: PORTABLE CHEST 1 VIEW COMPARISON:  Chest x-ray dated May 03, 2022. FINDINGS: The heart size and mediastinal contours are within normal limits. Normal pulmonary vascularity. Asymmetric interstitial thickening in the left lung appears mildly worsened since the prior study. No focal consolidation, pleural  effusion, or pneumothorax. No acute osseous abnormality. IMPRESSION: 1. Mildly worsened asymmetric interstitial thickening in the left lung, concerning for atypical infection. Electronically Signed   By: Obie Dredge M.D.   On: 05/17/2022 13:23   CT ABDOMEN PELVIS W CONTRAST  Result Date: 05/16/2022 CLINICAL DATA:  Abdominal pain. EXAM: CT ABDOMEN AND PELVIS WITH CONTRAST TECHNIQUE: Multidetector CT imaging of the abdomen and pelvis was performed using the standard protocol following bolus administration of  intravenous contrast. RADIATION DOSE REDUCTION: This exam was performed according to the departmental dose-optimization program which includes automated exposure control, adjustment of the mA and/or kV according to patient size and/or use of iterative reconstruction technique. CONTRAST:  OMNIPAQUE IOHEXOL 300 MG/ML  SOLN COMPARISON:  May 06, 2022 FINDINGS: Lower chest: Mosaic attenuation of the lung parenchyma, usually associated with small airway disease. Hepatobiliary: No focal liver abnormality is seen. Status post cholecystectomy. No biliary dilatation. Pancreas: Unremarkable. No pancreatic ductal dilatation or surrounding inflammatory changes. Spleen: Normal in size without focal abnormality. Adrenals/Urinary Tract: Normal adrenal glands. Normal right kidney. 5 mm nonobstructive calculus in the lower pole of the left kidney. No evidence of hydronephrosis. Normal ureters and urinary bladder. Stomach/Bowel: Stomach is within normal limits. No evidence of appendicitis. No evidence of bowel wall thickening, distention, or inflammatory changes. Left colonic diverticulosis without evidence diverticulitis. Vascular/Lymphatic: No significant vascular findings are present. No enlarged abdominal or pelvic lymph nodes. Reproductive: Status post hysterectomy. No adnexal masses. Other: No abdominal wall hernia or abnormality. No abdominopelvic ascites. Musculoskeletal: Severe compression deformity of L1  vertebral body with approximately 85% height loss centrally. Again seen is 8 mm retropulsion of fracture fragments into the spinal canal. Diffuse spondylosis of the lumbosacral spine. IMPRESSION: 1. No acute abnormalities within the abdomen or pelvis. 2. Left colonic diverticulosis without evidence of diverticulitis. 3. 5 mm nonobstructive calculus in the lower pole of the left kidney. 4. Severe compression deformity of L1 vertebral body with approximately 85% height loss centrally, stable from the CT dated May 06, 2022. Again seen is 8 mm retropulsion of fracture fragments into the spinal canal. Electronically Signed   By: Ted Mcalpine M.D.   On: 05/16/2022 14:29   CT HEAD WO CONTRAST ( )  Result Date: 05/16/2022 CLINICAL DATA:  Headache, sudden, severe EXAM: CT HEAD WITHOUT CONTRAST TECHNIQUE: Contiguous axial images were obtained from the base of the skull through the vertex without intravenous contrast. RADIATION DOSE REDUCTION: This exam was performed according to the departmental dose-optimization program which includes automated exposure control, adjustment of the mA and/or kV according to patient size and/or use of iterative reconstruction technique. COMPARISON:  None Available. FINDINGS: Brain: No evidence of acute infarction, hemorrhage, hydrocephalus, extra-axial collection or mass lesion/mass effect. Partially empty sella. Vascular: No hyperdense vessel identified. Skull: No acute fracture.  Hyperostosis frontalis. Sinuses/Orbits: Clear sinuses.  No acute orbital findings. Other: No mastoid effusions. IMPRESSION: No evidence of acute intracranial abnormality. Electronically Signed   By: Feliberto Harts M.D.   On: 05/16/2022 14:23   MR LUMBAR SPINE WO CONTRAST  Result Date: 05/06/2022 CLINICAL DATA:  Low back pain. L1 compression fracture with retropulsed bone. EXAM: MRI LUMBAR SPINE WITHOUT CONTRAST TECHNIQUE: Multiplanar, multisequence MR imaging of the lumbar spine was performed. No  intravenous contrast was administered. COMPARISON:  CT of the abdomen and pelvis 05/06/2022 FINDINGS: Segmentation: 5 non rib-bearing lumbar type vertebral bodies are present. The lowest fully formed vertebral body is L5. Alignment: No significant listhesis present. Mild straightening of the normal lumbar lordosis is present. Vertebrae: A vertebral plana compression fractures present at L1. Edema within the residual bone and in the fluid in the fracture cleft is consistent with acute/subacute fracture. Retropulsed bone extends 8 mm into the spinal canal. Canal is narrowed to 9 mm at the level of fracture. Tip of the conus medullaris at fracture level. Marrow signal and vertebral body heights are otherwise normal. Conus medullaris and cauda equina: Conus extends to the L1 level. Conus  and cauda equina appear normal. Paraspinal and other soft tissues: Limited imaging the abdomen is unremarkable. There is no significant adenopathy. No solid organ lesions are present. Disc levels: T12-L1: Retropulsed bone narrows the central canal is 9 mm. Mild bilateral foraminal stenosis is secondary to retropulsed bone. L1-2: Mild facet hypertrophy is present. No significant disc protrusion or stenosis is present. L2-3: A mild broad-based disc protrusion is present. Mild facet hypertrophy is noted bilaterally. This results in mild bilateral foraminal stenosis. L3-4: A broad-based disc protrusion present. Moderate facet hypertrophy is noted bilaterally. Central canal is patent. Mild foraminal narrowing is present bilaterally. L4-5: Broad-based disc protrusion present. Moderate facet hypertrophy is noted bilaterally. Mild subarticular foraminal narrowing is present bilaterally. L5-S1: Moderate facet hypertrophy is present bilaterally. No significant disc protrusion or stenosis is present. IMPRESSION: 1. Acute/subacute vertebral plana compression fractures at L1 with retropulsed bone resulting in mild central and bilateral foraminal  stenosis. 2. Mild bilateral foraminal narrowing at L2-3 and L3-4 secondary to broad-based disc protrusions and bilateral facet hypertrophy. 3. Mild subarticular and foraminal narrowing bilaterally at L4-5. This is the most significant level for disc disease. Electronically Signed   By: Marin Robertshristopher  Mattern M.D.   On: 05/06/2022 20:02   US Venous Img Lower Bilateral  Result Date: 05/06/2022 CLINICAL DATA:  Bilateral leg swelling EXAM: BILATERAL LOWER EXTREMITY VENOUS DOPPLER ULTRASOUND TECHNIQUE: Gray-scale sonography with graded compression, as well as color Doppler and duplex ultrasound were performed to evaluate the lower extremity deep venous systems from the level of the common femoral vein and including the common femoral, femoral, profunda femoral, popliteal and calf veins including the posterior tibial, peroneal and gastrocnemius veins when visible. The superficial great saphenous vein was also interrogated. Spectral Doppler was utilized to evaluate flow at rest and with distal augmentation maneuvers in the common femoral, femoral and popliteal veins. COMPARISON:  None Available. FINDINGS: RIGHT LOWER EXTREMITY Common Femoral Vein: No evidence of thrombus. Normal compressibility, respiratory phasicity and response to augmentation. Saphenofemoral Junction: No evidence of thrombus. Normal compressibility and flow on color Doppler imaging. Profunda Femoral Vein: No evidence of thrombus. Normal compressibility and flow on color Doppler imaging. Femoral Vein: No evidence of thrombus. Normal compressibility, respiratory phasicity and response to augmentation. Popliteal Vein: No evidence of thrombus. Normal compressibility, respiratory phasicity and response to augmentation. Calf Veins: No evidence of thrombus. Normal compressibility and flow on color Doppler imaging. Superficial Great Saphenous Vein: No evidence of thrombus. Normal compressibility. Venous Reflux:  None. Other Findings:  None. LEFT LOWER  EXTREMITY Common Femoral Vein: No evidence of thrombus. Normal compressibility, respiratory phasicity and response to augmentation. Saphenofemoral Junction: No evidence of thrombus. Normal compressibility and flow on color Doppler imaging. Profunda Femoral Vein: No evidence of thrombus. Normal compressibility and flow on color Doppler imaging. Femoral Vein: No evidence of thrombus. Normal compressibility, respiratory phasicity and response to augmentation. Popliteal Vein: No evidence of thrombus. Normal compressibility, respiratory phasicity and response to augmentation. Calf Veins: No evidence of thrombus. Normal compressibility and flow on color Doppler imaging. Superficial Great Saphenous Vein: No evidence of thrombus. Normal compressibility. Venous Reflux:  None. Other Findings:  None. IMPRESSION: No evidence of deep venous thrombosis in either lower extremity. Electronically Signed   By: Alcide CleverMark  Lukens M.D.   On: 05/06/2022 19:59   CT Abdomen Pelvis W Contrast  Result Date: 05/06/2022 CLINICAL DATA:  Diffuse abdominal pain, bloating, and diarrhea for 2 days. EXAM: CT ABDOMEN AND PELVIS WITH CONTRAST TECHNIQUE: Multidetector CT imaging of the abdomen and  pelvis was performed using the standard protocol following bolus administration of intravenous contrast. RADIATION DOSE REDUCTION: This exam was performed according to the departmental dose-optimization program which includes automated exposure control, adjustment of the mA and/or kV according to patient size and/or use of iterative reconstruction technique. CONTRAST:  OMNIPAQUE IOHEXOL 300 MG/ML  SOLN COMPARISON:  None Available. FINDINGS: Lower Chest: No acute findings. Hepatobiliary: No hepatic masses identified. Prior cholecystectomy. No evidence of biliary obstruction. Pancreas:  No mass or inflammatory changes. Spleen: Within normal limits in size and appearance. Adrenals/Urinary Tract: No masses identified. 8 mm calculus seen in lower pole of left  kidney. No evidence of ureteral calculi or hydronephrosis. Stomach/Bowel: No evidence of obstruction, inflammatory process or abnormal fluid collections. Vascular/Lymphatic: No pathologically enlarged lymph nodes. No acute vascular findings. Aortic atherosclerotic calcification incidentally noted. Reproductive: Prior hysterectomy noted. Adnexal regions are unremarkable in appearance. Other:  None. Musculoskeletal: No suspicious bone lesions identified. Right hip prosthesis noted. A severe compression fracture of L1 vertebral body is seen which is likely acute or subacute in age. Retropulsion of bone is seen into the spinal canal at this level measuring 9 mm. IMPRESSION: Severe compression fracture of L1 vertebral body, likely acute or subacute in age. Retropulsion of bone into the spinal canal at this level measures 9 mm. 8 mm nonobstructing left renal calculus. Aortic Atherosclerosis (ICD10-I70.0). Electronically Signed   By: Danae Orleans M.D.   On: 05/06/2022 17:14   MR BRAIN WO CONTRAST  Result Date: 05/03/2022 CLINICAL DATA:  Follow-up examination for stroke. EXAM: MRI HEAD WITHOUT CONTRAST TECHNIQUE: Multiplanar, multiecho pulse sequences of the brain and surrounding structures were obtained without intravenous contrast. COMPARISON:  Prior CT from earlier the same day. FINDINGS: Brain: Cerebral volume within normal limits for age. Mild hazy FLAIR signal intensity involving the periventricular white matter, most likely related chronic microvascular ischemic disease, overall minimal in nature in felt to be within normal limits for age. No evidence for acute or subacute ischemia. Gray-white matter differentiation maintained. No areas of chronic cortical infarction. No acute or chronic intracranial blood products. No mass lesion, midline shift or mass effect no hydrocephalus or extra-axial fluid collection. Partially empty sella noted. Suprasellar region normal. Vascular: Major intracranial vascular flow voids  are maintained. 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 No visible syrinx within the upper cervical spinal cord bone marrow signal intensity within normal limits. Hyperostosis frontalis interna noted. No scalp soft tissue abnormality. Sinuses/Orbits: Globes orbital soft tissues demonstrate no acute finding. Paranasal sinuses are largely clear. No significant mastoid effusion. Other: None. IMPRESSION: 1. No acute intracranial abnormality. 2. 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 3. Otherwise normal brain MRI. Electronically Signed   By: Rise Mu M.D.   On: 05/03/2022 21:18   DG Chest Portable 1 View  Result Date: 05/03/2022 CLINICAL DATA:  Increasing weakness for 2 weeks, hyperglycemia and dizziness EXAM: PORTABLE CHEST 1 VIEW COMPARISON:  Portable exam 1808 hours without priors for comparison FINDINGS: Normal heart size, mediastinal contours, and pulmonary vascularity. Lungs clear. No acute infiltrate, pleural effusion, or pneumothorax. Osseous demineralization with LEFT glenohumeral degenerative changes. IMPRESSION: No acute abnormalities. Electronically Signed   By: Ulyses Southward M.D.   On: 05/03/2022 18:29   CT HEAD WO CONTRAST ( )  Result Date: 05/03/2022 CLINICAL DATA:  Mental status changes of unknown cause EXAM: CT HEAD WITHOUT  CONTRAST TECHNIQUE: Contiguous axial images were obtained from the base of the skull through the vertex without intravenous contrast. RADIATION DOSE REDUCTION: This exam was performed according to the departmental dose-optimization program which includes automated exposure control, adjustment of the mA and/or kV according to patient size and/or use of iterative reconstruction technique. COMPARISON:  None FINDINGS: Brain: Generalized atrophy. Normal ventricular morphology. No midline shift or mass effect. Otherwise  normal appearance of brain parenchyma. No intracranial hemorrhage, mass lesion, or evidence of acute infarction. No extra-axial fluid collections. Vascular: Atherosclerotic calcification of internal carotid arteries at skull base. Skull: Osseous demineralization.  Hyperostosis frontalis interna. Sinuses/Orbits: Clear Other: N/A IMPRESSION: Generalized atrophy. No acute intracranial abnormalities. Electronically Signed   By: Ulyses Southward M.D.   On: 05/03/2022 18:25    Microbiology: Results for orders placed or performed during the hospital encounter of 05/22/22  Resp Panel by RT-PCR (Flu A&B, Covid) Anterior Nasal Swab     Status: None   Collection Time: 05/22/22  4:26 PM   Specimen: Anterior Nasal Swab  Result Value Ref Range Status   SARS Coronavirus 2 by RT PCR NEGATIVE NEGATIVE Final    Comment: (NOTE) SARS-CoV-2 target nucleic acids are NOT DETECTED.  The SARS-CoV-2 RNA is generally detectable in upper respiratory specimens during the acute phase of infection. The lowest concentration of SARS-CoV-2 viral copies this assay can detect is 138 copies/mL. A negative result does not preclude SARS-Cov-2 infection and should not be used as the sole basis for treatment or other patient management decisions. A negative result may occur with  improper specimen collection/handling, submission of specimen other than nasopharyngeal swab, presence of viral mutation(s) within the areas targeted by this assay, and inadequate number of viral copies(<138 copies/mL). A negative result must be combined with clinical observations, patient history, and epidemiological information. The expected result is Negative.  Fact Sheet for Patients:  BloggerCourse.com  Fact Sheet for Healthcare Providers:  SeriousBroker.it  This test is no t yet approved or cleared by the [place] FDA and  has been authorized for detection and/or diagnosis of SARS-CoV-2 by FDA  under an Emergency Use Authorization (EUA). This EUA will remain  in effect (meaning this test can be used) for the duration of the COVID-19 declaration under Section 564(b)(1) of the Act, 21 U.S.C.section 360bbb-3(b)(1), unless the authorization is terminated  or revoked sooner.       Influenza A by PCR NEGATIVE NEGATIVE Final   Influenza B by PCR NEGATIVE NEGATIVE Final    Comment: (NOTE) The Xpert Xpress SARS-CoV-2/FLU/RSV plus assay is intended as an aid in the diagnosis of influenza from Nasopharyngeal swab specimens and should not be used as a sole basis for treatment. Nasal washings and aspirates are unacceptable for Xpert Xpress SARS-CoV-2/FLU/RSV testing.  Fact Sheet for Patients: BloggerCourse.com  Fact Sheet for Healthcare Providers: SeriousBroker.it  This test is not yet approved or cleared by the [place] FDA and has been authorized for detection and/or diagnosis of SARS-CoV-2 by FDA under an Emergency Use Authorization (EUA). This EUA will remain in effect (meaning this test can be used) for the duration of the COVID-19 declaration under Section 564(b)(1) of the Act, 21 U.S.C. section 360bbb-3(b)(1), unless the authorization is terminated or revoked.  Performed at [facility], [address] [place], [place] [address]   C Difficile Quick Screen w PCR reflex     Status: None   Collection Time: 05/23/22  8:41 PM   Specimen: STOOL  Result Value Ref Range  Status   C Diff antigen NEGATIVE NEGATIVE Final   C Diff toxin NEGATIVE NEGATIVE Final   C Diff interpretation No C. difficile detected.  Final    Comment: Performed at [facility], [address] [place], [place] [address]  Gastrointestinal Panel by PCR , Stool     Status: None   Collection Time: 05/23/22  8:41 PM   Specimen: Stool  Result Value Ref Range Status   Campylobacter species NOT DETECTED NOT DETECTED Final   Plesimonas  shigelloides NOT DETECTED NOT DETECTED Final   Salmonella species NOT DETECTED NOT DETECTED Final   Yersinia enterocolitica NOT DETECTED NOT DETECTED Final   Vibrio species NOT DETECTED NOT DETECTED Final   Vibrio cholerae NOT DETECTED NOT DETECTED Final   Enteroaggregative E coli (EAEC) NOT DETECTED NOT DETECTED Final   Enteropathogenic E coli (EPEC) NOT DETECTED NOT DETECTED Final   Enterotoxigenic E coli (ETEC) NOT DETECTED NOT DETECTED Final   Shiga like toxin producing E coli (STEC) NOT DETECTED NOT DETECTED Final   Shigella/Enteroinvasive E coli (EIEC) NOT DETECTED NOT DETECTED Final   Cryptosporidium NOT DETECTED NOT DETECTED Final   Cyclospora cayetanensis NOT DETECTED NOT DETECTED Final   Entamoeba histolytica NOT DETECTED NOT DETECTED Final   Giardia lamblia NOT DETECTED NOT DETECTED Final   Adenovirus F40/41 NOT DETECTED NOT DETECTED Final   Astrovirus NOT DETECTED NOT DETECTED Final   Norovirus GI/GII NOT DETECTED NOT DETECTED Final   Rotavirus A NOT DETECTED NOT DETECTED Final   Sapovirus (I, II, IV, and V) NOT DETECTED NOT DETECTED Final    Comment: Performed at [facility], [address] [place], [place] [address]    Labs: CBC: Recent Labs  Lab 05/20/22 0708 05/22/22 1218 05/23/22 0147 05/25/22 0421 05/26/22 0334  WBC 8.5 12.0* 11.1* 10.9* 9.6  NEUTROABS  --   --   --  6.9 6.0  HGB 10.9* 11.8* 10.3* 9.4* 9.7*  HCT 34.0* 38.1 32.7* 29.8* 31.4*  MCV 89.0 91.8 89.8 89.5 92.4  PLT 312 415* 356 385 382   Basic Metabolic Panel: Recent Labs  Lab 05/20/22 0708 05/21/22 0429 05/22/22 1218 05/23/22 0147 05/24/22 1403 05/25/22 0421 05/26/22 0334  NA 140  --  134* 137 136 136 138  K 5.3*   < > 4.4 4.0 3.5 4.1 4.3  CL 106  --  99 105 101 102 104  CO2 28  --  GLUCOSE 177*  --  324* 144* 298* 185* 106*  BUN 10  --  CREATININE 0.64  --  0.71 0.63 0.71 0.56 0.52  CALCIUM 9.0  --  9.0 8.6* 8.3* 8.7* 8.9  MG 1.8  --   --   1.6* 2.0  --   --   PHOS  --   --   --  3.1  --   --   --    < > = values in this interval not displayed.   Liver Function Tests: Recent Labs  Lab 05/22/22 1218 05/23/22 0147  AST 23 18  ALT 19 16  ALKPHOS 114 95  BILITOT 0.5 0.4  PROT 6.4* 5.6*  ALBUMIN 2.8* 2.5*   CBG: Recent Labs  Lab 05/25/22 1322 05/25/22 1623 05/25/22 2013 05/26/22 0804 05/26/22 1138  GLUCAP 196* 191* 194* 62* 224*    Discharge time spent: greater than 30 minutes.  Signed: Hady Niemczyk, DO [facility] 05/26/2022

## 2022-05-28 ENCOUNTER — Telehealth: Payer: Self-pay | Admitting: Infectious Diseases

## 2022-05-28 NOTE — Telephone Encounter (Signed)
Informed daughter cortisol level borderline low 5.6/6 And acth low as well She will discuss with her PCP next monday

## 2022-05-29 ENCOUNTER — Other Ambulatory Visit: Payer: Self-pay | Admitting: Nurse Practitioner

## 2022-05-29 DIAGNOSIS — R142 Eructation: Secondary | ICD-10-CM

## 2022-05-29 DIAGNOSIS — R1310 Dysphagia, unspecified: Secondary | ICD-10-CM

## 2022-05-29 DIAGNOSIS — R633 Feeding difficulties, unspecified: Secondary | ICD-10-CM

## 2022-06-08 ENCOUNTER — Telehealth: Payer: Self-pay | Admitting: Internal Medicine

## 2022-06-08 NOTE — Telephone Encounter (Signed)
HH ORDERS   Caller Name: [facility] Agency Name: [facility] Phone #: [phone] secure line  Service Requested: OT (examples: OT/PT/Skilled Nursing/Social Work/Speech Therapy/Wound Care)  Frequency of Visits: 1 week 2

## 2022-06-08 NOTE — Telephone Encounter (Signed)
OK with HH as requested 

## 2022-06-08 NOTE — Telephone Encounter (Signed)
Called gave verbal orders as requested and approved by Dr. Selena Batten

## 2022-11-08 ENCOUNTER — Telehealth: Payer: Self-pay | Admitting: Internal Medicine

## 2022-11-08 NOTE — Telephone Encounter (Signed)
Angie from [facility] would like a phone call to let her know who's care will this patient will be in going forward,she needs orders to be signed off on. The orders she has are in bedsoles name but Anda Kraft is listed as her PCP now that Dr Einar Pheasant isn't here. She would ,ike to know who will be signing off on the orders?

## 2022-11-08 NOTE — Telephone Encounter (Signed)
Lisa Friendly, MD is listed as her PCP, not me. I am a Designer, jewellery. Lisa Friendly MD practices through [facility] and from chart review it looks like she's seeing Lisa Friendly MD.

## 2022-11-08 NOTE — Telephone Encounter (Signed)
Lvmtcb, left detailed message on vm

## 2023-03-22 ENCOUNTER — Other Ambulatory Visit: Payer: Self-pay

## 2023-03-22 ENCOUNTER — Emergency Department: Payer: Medicare Other

## 2023-03-22 ENCOUNTER — Emergency Department
Admission: EM | Admit: 2023-03-22 | Discharge: 2023-03-22 | Disposition: A | Discharge status: Home / Self Care | Payer: Medicare Other | Attending: Emergency Medicine | Admitting: Emergency Medicine

## 2023-03-22 DIAGNOSIS — N390 Urinary tract infection, site not specified: Secondary | ICD-10-CM | POA: Insufficient documentation

## 2023-03-22 DIAGNOSIS — M25512 Pain in left shoulder: Secondary | ICD-10-CM | POA: Diagnosis present

## 2023-03-22 DIAGNOSIS — M19019 Primary osteoarthritis, unspecified shoulder: Secondary | ICD-10-CM | POA: Diagnosis not present

## 2023-03-22 LAB — COMPREHENSIVE METABOLIC PANEL
ALT: 15 U/L (ref 0–44)
AST: 29 U/L (ref 15–41)
Albumin: 3.4 g/dL — ABNORMAL LOW (ref 3.5–5.0)
Alkaline Phosphatase: 95 U/L (ref 38–126)
Anion gap: 9 (ref 5–15)
BUN: 15 mg/dL (ref 8–23)
CO2: 31 mmol/L (ref 22–32)
Calcium: 9.3 mg/dL (ref 8.9–10.3)
Chloride: 100 mmol/L (ref 98–111)
Creatinine, Ser: 0.76 mg/dL (ref 0.44–1.00)
GFR, Estimated: >60 mL/min (ref >60)
Glucose, Bld: 158 mg/dL — ABNORMAL HIGH (ref 70–99)
Potassium: 4.3 mmol/L (ref 3.5–5.1)
Sodium: 140 mmol/L (ref 135–145)
Total Bilirubin: 0.4 mg/dL (ref 0.3–1.2)
Total Protein: 7.1 g/dL (ref 6.5–8.1)

## 2023-03-22 LAB — CBC WITH DIFFERENTIAL/PLATELET
Abs Immature Granulocytes: 0.01 10*3/uL (ref 0.00–0.07)
Basophils Absolute: 0 10*3/uL (ref 0.0–0.1)
Basophils Relative: 1 %
Eosinophils Absolute: 0.3 10*3/uL (ref 0.0–0.5)
Eosinophils Relative: 3 %
HCT: 36.8 % (ref 36.0–46.0)
Hemoglobin: 11.1 g/dL — ABNORMAL LOW (ref 12.0–15.0)
Immature Granulocytes: 0 %
Lymphocytes Relative: 30 %
Lymphs Abs: 2.4 10*3/uL (ref 0.7–4.0)
MCH: 27.6 pg (ref 26.0–34.0)
MCHC: 30.2 g/dL (ref 30.0–36.0)
MCV: 91.5 fL (ref 80.0–100.0)
Monocytes Absolute: 0.5 10*3/uL (ref 0.1–1.0)
Monocytes Relative: 7 %
Neutro Abs: 4.8 10*3/uL (ref 1.7–7.7)
Neutrophils Relative %: 59 %
Platelets: 360 10*3/uL (ref 150–400)
RBC: 4.02 MIL/uL (ref 3.87–5.11)
RDW: 15.8 % — ABNORMAL HIGH (ref 11.5–15.5)
WBC: 8 10*3/uL (ref 4.0–10.5)
nRBC: 0 % (ref 0.0–0.2)

## 2023-03-22 LAB — URINALYSIS, ROUTINE W REFLEX MICROSCOPIC
Bacteria, UA: NONE SEEN
Bilirubin Urine: NEGATIVE
Glucose, UA: NEGATIVE mg/dL
Hgb urine dipstick: NEGATIVE
Ketones, ur: NEGATIVE mg/dL
Nitrite: NEGATIVE
Protein, ur: NEGATIVE mg/dL
Specific Gravity, Urine: 1.016 (ref 1.005–1.030)
pH: 5 (ref 5.0–8.0)

## 2023-03-22 MED ORDER — CEPHALEXIN 500 MG PO CAPS: 1000.0000 mg | ORAL_CAPSULE | Freq: Two times a day (BID) | ORAL | 0 refills | Status: AC

## 2023-03-22 MED ORDER — MELOXICAM 7.5 MG PO TABS: 7.5000 mg | ORAL_TABLET | Freq: Every day | ORAL | 0 refills | Status: AC

## 2023-03-22 MED ORDER — CEPHALEXIN 500 MG PO CAPS
500.0000 mg | ORAL_CAPSULE | Freq: Once | ORAL | Status: AC
  Administered 2023-03-22: 500 mg via ORAL
  Filled 2023-03-22: qty 1

## 2023-03-22 MED ORDER — MELOXICAM 7.5 MG PO TABS
7.5000 mg | ORAL_TABLET | Freq: Once | ORAL | Status: AC
  Administered 2023-03-22: 7.5 mg via ORAL
  Filled 2023-03-22: qty 1

## 2023-03-22 NOTE — ED Provider Notes (Signed)
[facility] Provider Note  Patient Contact: 9:06 PM (approximate)   History   Urinary Retention, Abnormal Lab, and Leg Pain   HPI  Lisa Davis is a 87 y.o. female who presents the emergency department for 2 complaints.  Patient is complaining of sharp left shoulder pain.  Significant history of arthritis, has had multiple injections into the shoulder.  She does have orthopedics that she follows with for this complaint.  She had increased pain without trauma.  Patient is also complaining of possible urinary retention.  She feels like she has to urinate, will strain and only put out a small amount of urine.  Patient denies any flank pain, fevers, chills, emesis, diarrhea or constipation.     Physical Exam   Triage Vital Signs: ED Triage Vitals  Enc Vitals Group     BP 03/22/23 1601 (!) 136/48     Pulse Rate 03/22/23 1601 85     Resp 03/22/23 1601 16     Temp 03/22/23 1601 98.6 F (37 C)     Temp Source 03/22/23 1601 Oral     SpO2 03/22/23 1601 92 %     Weight 03/22/23 1602 154 lb (69.9 kg)     Height 03/22/23 1602 5\' 3"  (1.6 m)     Head Circumference --      Peak Flow --      Pain Score 03/22/23 1601 4     Pain Loc --      Pain Edu? --      Excl. in GC? --     Most recent vital signs: Vitals:   03/22/23 1601 03/22/23 1845  BP: (!) 136/48 (!) 157/65  Pulse: 85 82  Resp: 16 18  Temp: 98.6 F (37 C)   SpO2: 92% 98%     General: Alert and in no acute distress. ENT:      Ears:       Nose: No congestion/rhinnorhea.      Mouth/Throat: Mucous membranes are moist. Neck: No stridor. No cervical spine tenderness to palpation.  Cardiovascular:  Good peripheral perfusion Respiratory: Normal respiratory effort without tachypnea or retractions. Lungs CTAB. Good air entry to the bases with no decreased or absent breath sounds. Gastrointestinal: Bowel sounds 4 quadrants. Soft and nontender to palpation. No guarding or rigidity. No palpable  masses. No distention. No CVA tenderness. Musculoskeletal: Full range of motion to all extremities.  Neurologic:  No gross focal neurologic deficits are appreciated.  Skin:   No rash noted Other:   ED Results / Procedures / Treatments   Labs (all labs ordered are listed, but only abnormal results are displayed) Labs Reviewed  CBC WITH DIFFERENTIAL/PLATELET - Abnormal; Notable for the following components:      Result Value   Hemoglobin 11.1 (*)    RDW 15.8 (*)    All other components within normal limits  COMPREHENSIVE METABOLIC PANEL - Abnormal; Notable for the following components:   Glucose, Bld 158 (*)    Albumin 3.4 (*)    All other components within normal limits  URINALYSIS, ROUTINE W REFLEX MICROSCOPIC - Abnormal; Notable for the following components:   Color, Urine YELLOW (*)    APPearance CLEAR (*)    Leukocytes,Ua SMALL (*)    All other components within normal limits     EKG     RADIOLOGY  I personally viewed, evaluated, and interpreted these images as part of my medical decision making, as well as reviewing the written report by  the radiologist.  ED Provider Interpretation: Severe degenerative changes  DG Shoulder Left  Result Date: 03/22/2023 CLINICAL DATA:  Left shoulder pain EXAM: LEFT SHOULDER - 2+ VIEW COMPARISON:  Left shoulder MRI report only 08/05/2003. FINDINGS: There is no acute fracture or dislocation. There is severe glenohumeral and subacromial joint space narrowing with osteophyte formation and subchondral cystic change. Erosive changes of the humeral head not excluded. There is chronic appearing deformity of the distal clavicle and acromion. There is soft tissue swelling lateral to the humeral head which may represent joint effusion. IMPRESSION: 1. No acute fracture or dislocation. 2. Severe glenohumeral and subacromial joint space narrowing with osteophyte formation and subchondral cystic change. Erosive changes of the humeral head not excluded.  Please correlate clinically. Consider follow-up MRI. 3. Questionable joint effusion. Electronically Signed   By: Darliss Cheney M.D.   On: 03/22/2023 19:37    PROCEDURES:  Critical Care performed: No  Procedures   MEDICATIONS ORDERED IN ED: Medications  cephALEXin (KEFLEX) capsule 500 mg (has no administration in time range)  meloxicam (MOBIC) tablet 7.5 mg (has no administration in time range)     IMPRESSION / MDM / ASSESSMENT AND PLAN / ED COURSE  I reviewed the triage vital signs and the nursing notes.                                 Differential diagnosis includes, but is not limited to, shoulder dislocation, shoulder fracture, arthritis, bursitis, septic arthritis, UTI, urinary retention, interstitial cystitis   Patient's presentation is most consistent with acute presentation with potential threat to life or bodily function.   Patient's diagnosis is consistent with UTI, arthritis of the shoulder.  Patient presents emergency department with feeling as if she is retaining urine.  She states that she has straining with only small amount of urine output.  Patient's last normal urination was this morning.  No fevers or chills, abdominal pain, flank pain.  Leukocytes in the urinalysis, only 100 mL of urine in the bladder on bladder scan.  At this time given the symptoms I suspect of mild UTI.  Patient was also complaining of increased shoulder pain.  History of arthritis and x-ray reveals severe arthritic changes.  She has a orthopedic surgeon and can follow-up with them for further management.  Low-dose anti-inflammatory given her age, antibiotic for the UTI.  Will treat with steroid but patient has a suspected infection as well as diabetes.  Patient is agreeable with this plan.  Follow-up and return precautions discussed with patient at this time.. Patient is given ED precautions to return to the ED for any worsening or new symptoms.     FINAL CLINICAL IMPRESSION(S) / ED DIAGNOSES    Final diagnoses:  Lower urinary tract infectious disease  Arthritis of shoulder     Rx / DC Orders   ED Discharge Orders          Ordered    cephALEXin (KEFLEX) 500 MG capsule  2 times daily        03/22/23 2123    meloxicam (MOBIC) 7.5 MG tablet  Daily        03/22/23 2123             Note:  This document was prepared using Dragon voice recognition software and may include unintentional dictation errors.   Lanette Hampshire 03/22/23 2123    Pilar Jarvis, MD 03/23/23 1505

## 2023-03-22 NOTE — ED Notes (Signed)
PT was bladder scanned by this tech was the most found on a scan.

## 2023-03-22 NOTE — ED Triage Notes (Signed)
Pt to ED with family for several complaints. Pt has arthritis and neuropathy with increased pain in L knee, L foot and L shoulder since yesterday. Harder to walk since yesterday due to pain. Walks with walker at baseline. Also has abnormal labs at PCP office since about 3 weeks ago including low Mg. Pt also complains of urinary retention. Has urinated one time today, this morning. Hx of same intermittently but this episode has been since today.  Pt had oxycontin at 1330 today.  Pt is alert and oriented.

## 2023-09-19 IMAGING — US US RENAL
1 series · 14 of 25 positions shown · non-contrast
Comparison: None.

CLINICAL DATA: Lower abdominal pain.  History of nephrolithiasis.

EXAM:
RENAL / URINARY TRACT ULTRASOUND COMPLETE

[Series 1: us renal · 0.23mm/px · 14 of 54 slices shown]
[im 1/54]
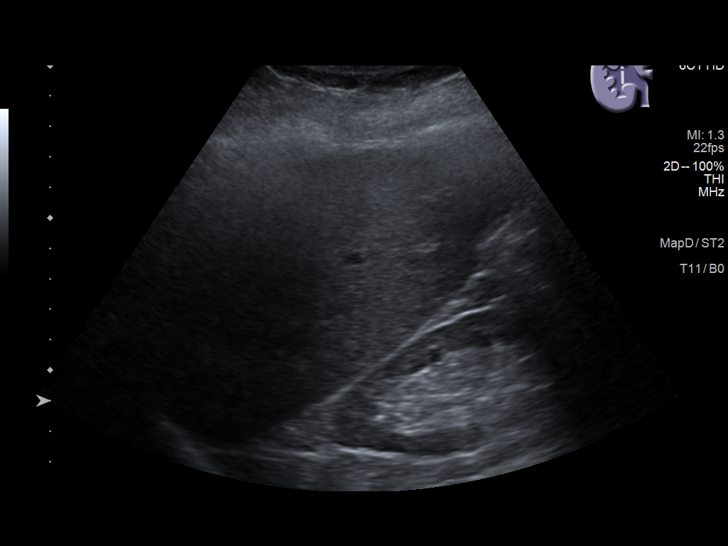
[im 5/54]
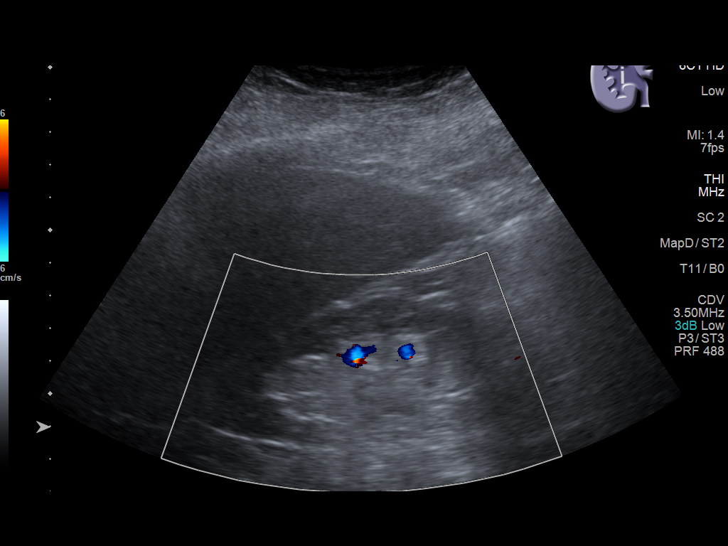
[im 9/54]
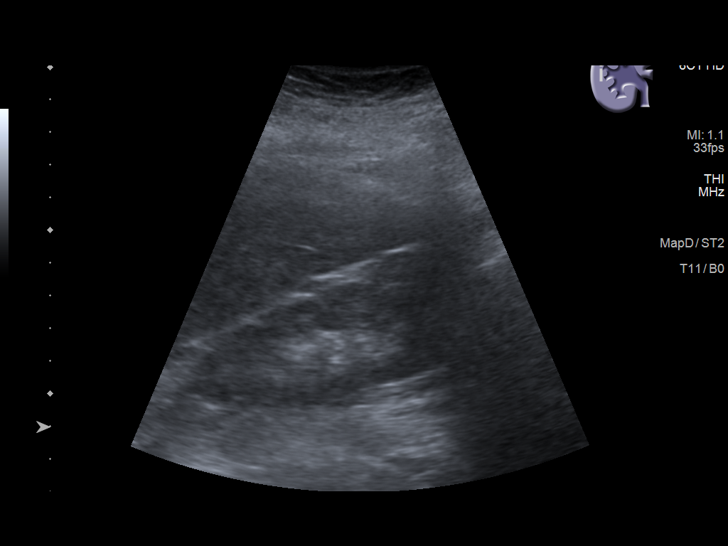
[im 14/54]
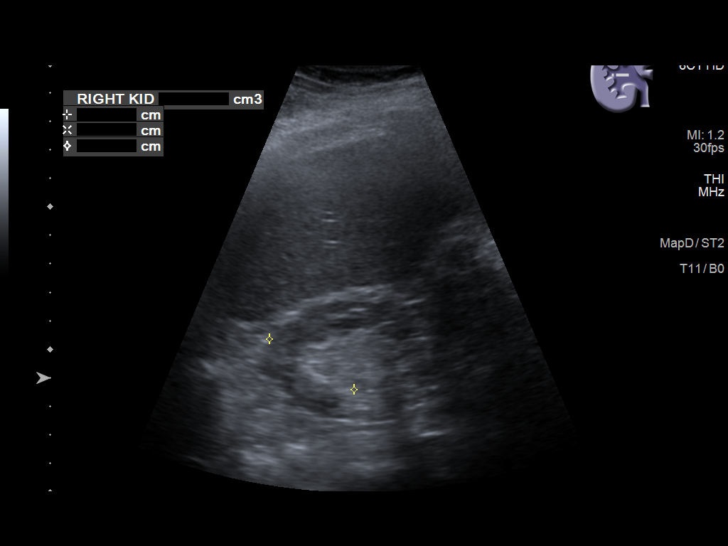
[im 18/54]
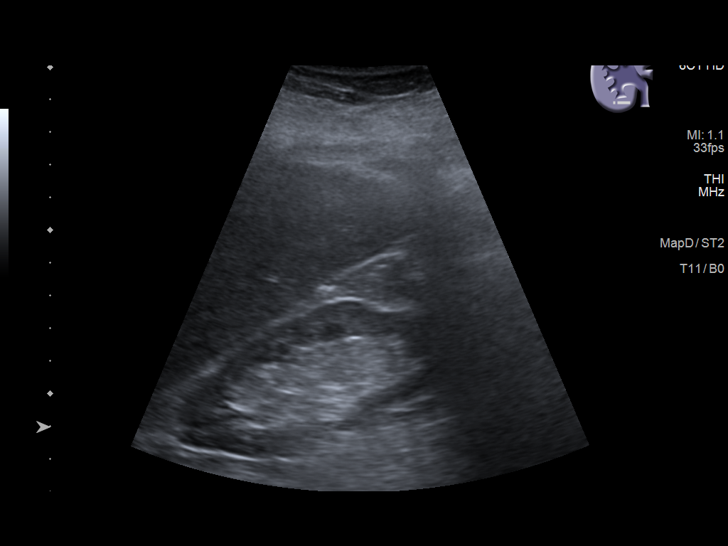
[im 20/54]
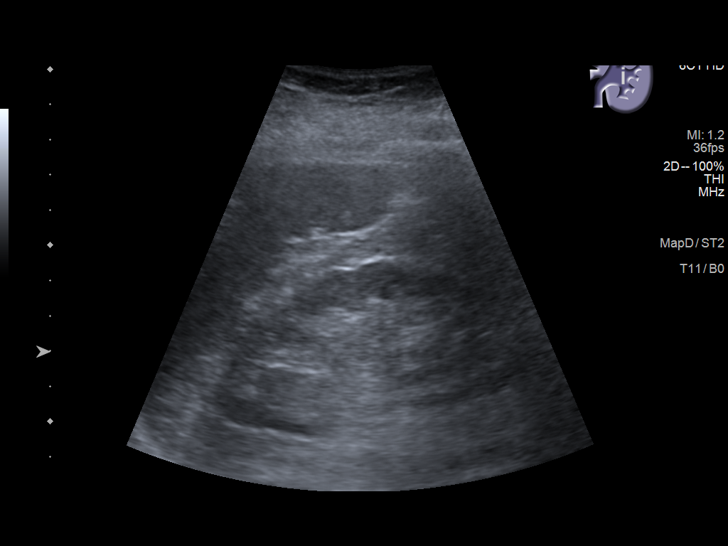
[im 25/54]
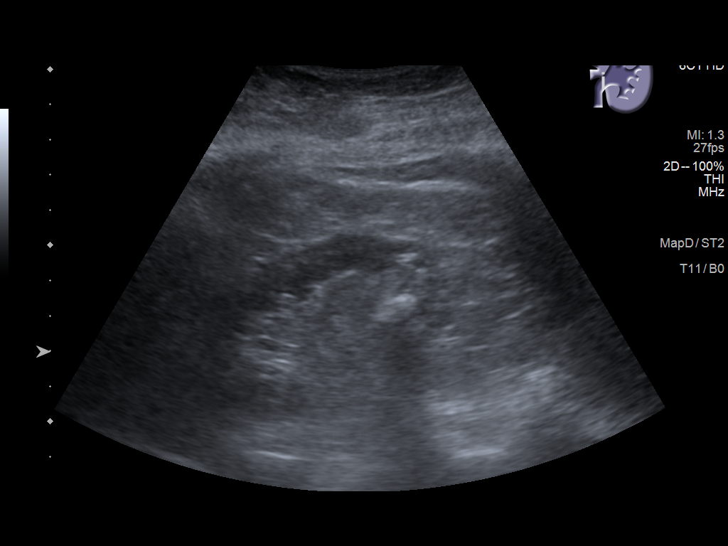
[im 29/54]
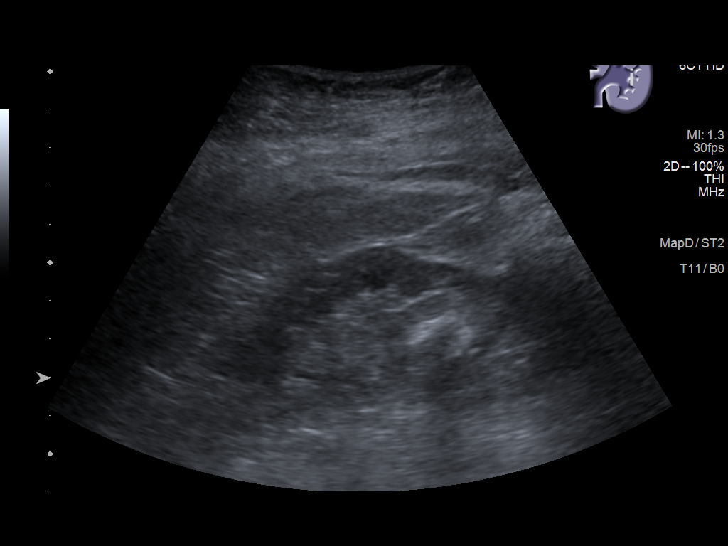
[im 34/54]
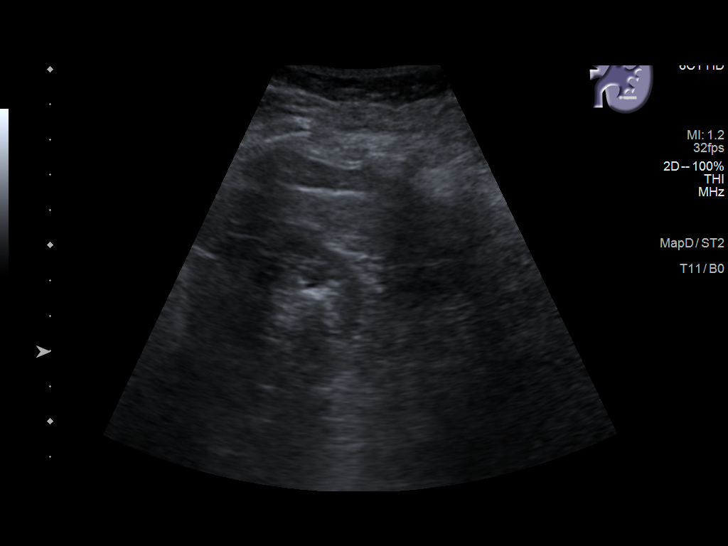
[im 36/54]
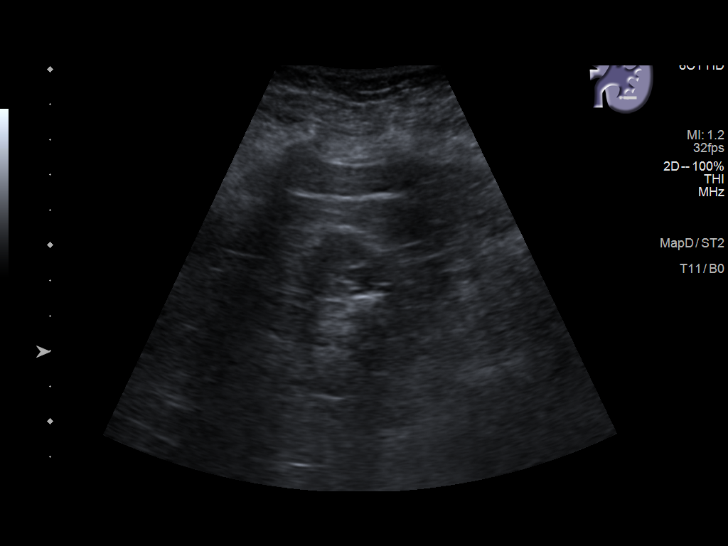
[im 40/54]
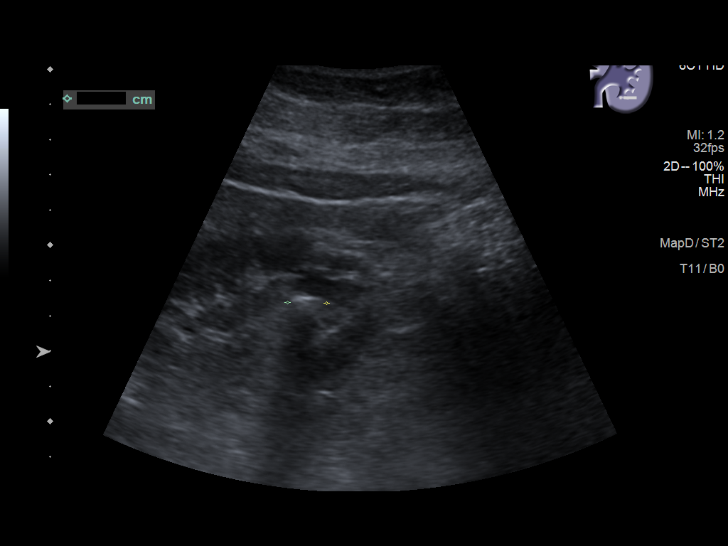
[im 45/54]
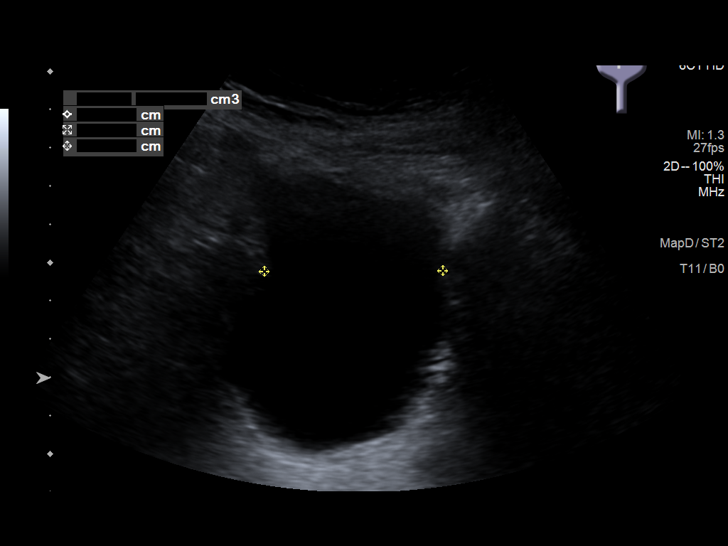
[im 49/54]
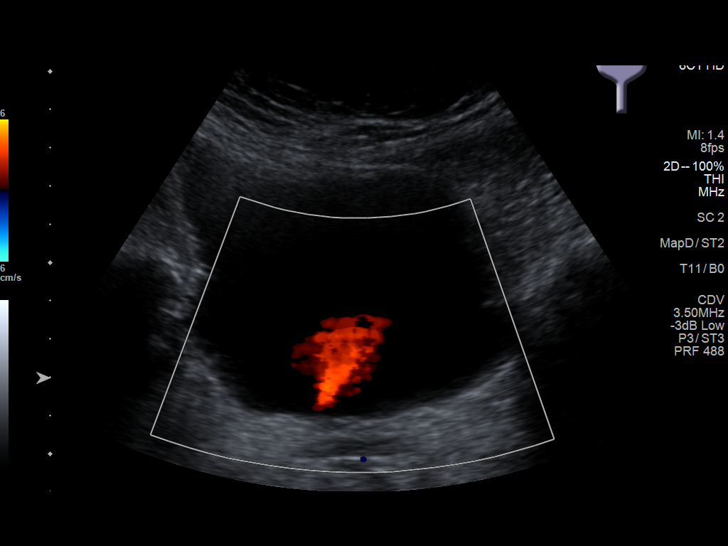
[im 54/54]
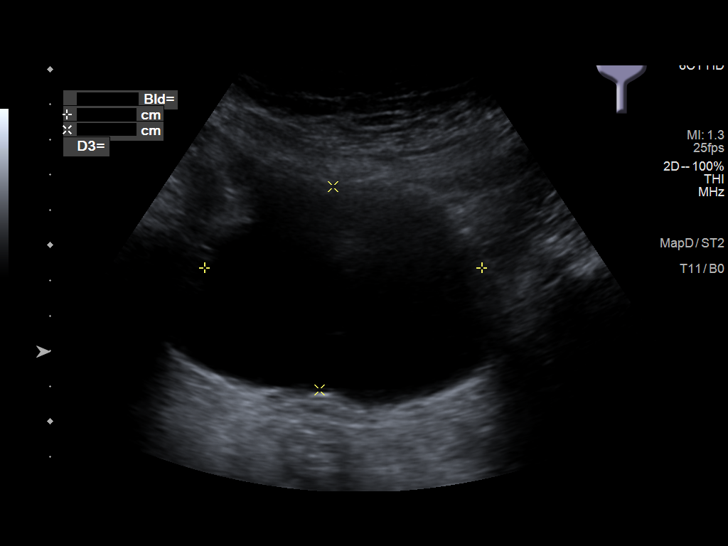

[14 of 25 positions shown; findings below may reference images not displayed]

FINDINGS: Right Kidney:

Renal measurements: 9.2 x 4.1 x 3.3 cm = volume: 64.8 mL. Diffuse
cortical thinning. Normal renal parenchymal echogenicity. No mass or
stone. No hydronephrosis.

Left Kidney:

Renal measurements: 9 x 3.5 x 4.4 cm = volume: 72 mL. Mild diffuse
cortical thinning. Normal renal parenchymal echogenicity. No mass. 1
cm echogenic focus in the lower pole consistent with a
nonobstructing stone. No hydronephrosis.

Bladder:

Appears normal for degree of bladder distention. Bilateral ureteral
jets visualized.

Other:

None.
IMPRESSION: 1. Mild bilateral renal cortical thinning with mild reduction in
renal sizes.
2. No hydronephrosis.  No acute finding.
3. Probable nonobstructing stone in the lower pole of the left
kidney.
